# Patient Record
Sex: Female | Born: 1950 | Race: White | Hispanic: No | Smoking: Former smoker
Health system: Southern US, Community
[De-identification: ages and names within clinical notes are randomized; demographics above are authoritative.]

## PROBLEM LIST (undated history)

## (undated) DIAGNOSIS — T7840XA Allergy, unspecified, initial encounter: Secondary | ICD-10-CM

## (undated) DIAGNOSIS — I10 Essential (primary) hypertension: Secondary | ICD-10-CM

## (undated) DIAGNOSIS — H409 Unspecified glaucoma: Secondary | ICD-10-CM

## (undated) DIAGNOSIS — H269 Unspecified cataract: Secondary | ICD-10-CM

## (undated) HISTORY — PX: COLONOSCOPY: SHX174

## (undated) HISTORY — DX: Allergy, unspecified, initial encounter: T78.40XA

## (undated) HISTORY — DX: Unspecified glaucoma: H40.9

## (undated) HISTORY — DX: Unspecified cataract: H26.9

---

## 2013-10-06 ENCOUNTER — Emergency Department (HOSPITAL_COMMUNITY)
Admission: EM | Admit: 2013-10-06 | Discharge: 2013-10-06 | Disposition: A | Discharge status: Home / Self Care | Payer: BC Managed Care – PPO | Attending: Emergency Medicine | Admitting: Emergency Medicine

## 2013-10-06 ENCOUNTER — Encounter (HOSPITAL_COMMUNITY): Payer: Self-pay | Admitting: Emergency Medicine

## 2013-10-06 DIAGNOSIS — I10 Essential (primary) hypertension: Secondary | ICD-10-CM | POA: Insufficient documentation

## 2013-10-06 DIAGNOSIS — F410 Panic disorder [episodic paroxysmal anxiety] without agoraphobia: Secondary | ICD-10-CM | POA: Insufficient documentation

## 2013-10-06 DIAGNOSIS — R002 Palpitations: Secondary | ICD-10-CM

## 2013-10-06 HISTORY — DX: Essential (primary) hypertension: I10

## 2013-10-06 LAB — POCT I-STAT, CHEM 8
BUN: 10 mg/dL (ref 6–23)
Calcium, Ion: 1.22 mmol/L (ref 1.13–1.30)
Chloride: 105 mEq/L (ref 96–112)
Creatinine, Ser: 0.9 mg/dL (ref 0.50–1.10)
Glucose, Bld: 95 mg/dL (ref 70–99)
HCT: 45 % (ref 36.0–46.0)
Hemoglobin: 15.3 g/dL — ABNORMAL HIGH (ref 12.0–15.0)
Potassium: 3.7 mEq/L (ref 3.5–5.1)
Sodium: 144 mEq/L (ref 135–145)
TCO2: 28 mmol/L (ref 0–100)

## 2013-10-06 MED ORDER — LORAZEPAM 1 MG PO TABS
1.0000 mg | ORAL_TABLET | Freq: Once | ORAL | Status: AC
  Administered 2013-10-06: 1 mg via ORAL
  Filled 2013-10-06: qty 1

## 2013-10-06 NOTE — ED Notes (Signed)
Pt states that she is having palpitations, "her heart is racing" and she took her BP at work it was elevated.  Pt states this feels like an anxiety attack.

## 2013-10-06 NOTE — ED Notes (Signed)
Pt states she felt like she was having heart palpitations since yesterday and this morning pt states she was driving and they came on again, she got scared and had to pull over to calm herself down.

## 2013-10-06 NOTE — ED Provider Notes (Signed)
CSN: [id number]     Arrival date & time 10/06/13  0759 History   First MD Initiated Contact with Patient 10/06/13 0809     Chief Complaint  Patient presents with  . Panic Attack  . Palpitations   (Consider location/radiation/quality/duration/timing/severity/associated sxs/prior Treatment) HPI  [age]-year-old female with palpitations. Onset last night when she was is getting ready for bed. She felt like her heart was both beating fast and irregularly. Denies any chest pain or shortness of breath. The symptoms continued before she finally fell asleep. She woke up in her usual state of health. On her way to work she was driving the symptoms began again. Currently feels better, although not completely back to her baseline. Denies any drug use. Drinks 2 cups of coffee in the morning but this has been a retained for several years. No recent medication changes. Past history of hypertension, otherwise healthy. No unusual leg pain or swelling.She had similar symptoms a few years ago when she was going through divorce and it was attributed to anxiety then.   Past Medical History  Diagnosis Date  . Hypertension    History reviewed. No pertinent past surgical history. No family history on file. History  Substance Use Topics  . Smoking status: Never Smoker   . Smokeless tobacco: Not on file  . Alcohol Use: No   OB History   Grav Para Term Preterm Abortions TAB SAB Ect Mult Living                 Review of Systems  All systems reviewed and negative, other than as noted in HPI.   Allergies  Review of patient's allergies indicates no known allergies.  Home Medications  No current outpatient prescriptions on file. BP 163/97  Pulse 85  Temp(Src) 98.5 F (36.9 C) (Oral)  Resp 18  SpO2 99% Physical Exam  Nursing note and vitals reviewed. Constitutional: She appears well-developed and well-nourished. No distress.  HENT:  Head: Normocephalic and atraumatic.  Eyes: Conjunctivae are  normal. Right eye exhibits no discharge. Left eye exhibits no discharge.  Neck: Neck supple.  Cardiovascular: Normal rate, regular rhythm and normal heart sounds.  Exam reveals no gallop and no friction rub.   No murmur heard. Pulmonary/Chest: Effort normal and breath sounds normal. No respiratory distress.  Abdominal: Soft. She exhibits no distension. There is no tenderness.  Musculoskeletal: She exhibits no edema and no tenderness.  Lower extremities symmetric as compared to each other. No calf tenderness. Negative Homan's. No palpable cords.   Neurological: She is alert.  Skin: Skin is warm and dry.  Psychiatric: Her behavior is normal. Thought content normal.  Appears anxious.     ED Course  Procedures (including critical care time) Labs Review Labs Reviewed - No data to display Imaging Review No results found.  EKG Interpretation     Ventricular Rate:  61 PR Interval:  126 QRS Duration: 88 QT Interval:  423 QTC Calculation: 426 R Axis:   71 Text Interpretation:  Sinus rhythm nsst anterior precordial leads            MDM   1. Heart palpitations    62yF with palpitations. EKG fairly unremarkable. No significant ectopy or dysrhythmia during monitoring. Basic labs reassuring. No pain or respiratory complaints. possible anxiety component. Return precautions discussed.     Raeford Razor, MD 10/11/13 [phone]

## 2014-02-15 ENCOUNTER — Ambulatory Visit (INDEPENDENT_AMBULATORY_CARE_PROVIDER_SITE_OTHER): Payer: BC Managed Care – PPO | Admitting: Cardiology

## 2014-02-15 ENCOUNTER — Encounter: Payer: Self-pay | Admitting: Cardiology

## 2014-02-15 VITALS — BP 122/78 | HR 66 | Ht 60.0 in | Wt 101.0 lb

## 2014-02-15 DIAGNOSIS — R002 Palpitations: Secondary | ICD-10-CM

## 2014-02-15 NOTE — Progress Notes (Signed)
[address] [place], [place]  [address] Phone: [phone] Fax:  [phone]  Date:  02/15/2014   ID:  Julie Perkins Perkins, DOB [birth year], MRN [id number]  PCP:  Default, Provider, MD   History of Present Illness: Julie Perkins is a 63 y.o. female here for evaluation of palpitations. She was seen in the emergency department on 10/06/13 with palpitations which began when she was getting ready for bed. She felt as though her heart was beating fast and irregular. She was without chest pain or shortness of breath. After she woke up, she felt better but on her weight at work she began having symptoms again. Pulled over. Went on into work and then left and went to ER. She thought maybe it was because she was going through divorce and had associated anxiety. No symptoms since. No syncope.    Wt Readings from Last 3 Encounters:  02/15/14 101 lb (45.813 kg)     Past Medical History  Diagnosis Date  . Hypertension     History reviewed. No pertinent past surgical history.  Current Outpatient Prescriptions  Medication Sig Dispense Refill  . amLODipine-benazepril (LOTREL) 10-20 MG per capsule Take 1 capsule by mouth daily.      Marland Kitchen. aspirin 81 MG tablet Take 81 mg by mouth daily.      . cholecalciferol (VITAMIN D) 1000 UNITS tablet Take 1,000 Units by mouth daily.       No current facility-administered medications for this visit.    Allergies:   No Known Allergies  Social History:  The patient  reports that she has quit smoking. She does not have any smokeless tobacco history on file. She reports that she does not drink alcohol or use illicit drugs.   Family History  Problem Relation Age of Onset  . Hypertension Mother   . Hypertension Father   . Hypertension Sister   . Hypertension Brother   . Breast cancer Paternal Aunt   . Heart failure Maternal Grandmother   . Cancer - Prostate Maternal Grandfather   . Hypertension Sister   . Hypertension Brother   No  early CAD in family.   ROS:  Please see the history of present illness.   Denies any fevers, chills, strokelike symptoms, syncope, orthopnea, PND, rashes, bleeding, chest pain, shortness of breath   All other systems reviewed and negative.   PHYSICAL EXAM: VS:  BP 122/78  Pulse 66  Ht 5' (1.524 m)  Wt 101 lb (45.813 kg)  BMI 19.73 kg/m2 Thin, in no acute distress HEENT: normal, [place]/AT, EOMI Neck: no JVD, normal carotid upstroke, no bruit Cardiac:  normal S1, S2; RRR; no murmur Lungs:  clear to auscultation bilaterally, no wheezing, rhonchi or rales Abd: soft, nontender, no hepatomegaly, no bruits Ext: no edema, 2+ distal pulses Skin: warm and dry GU: deferred Neuro: no focal abnormalities noted, AAO x 3  EKG:  10/08/13 - NSR no other abnormalites.     ASSESSMENT AND PLAN:  1. Palpitations-possibilities include PSVT, PVCs, PAT. Thankfully, she has not had any recurrence of symptoms. Her symptoms were transient, short-lived, no associated anginal symptoms, no syncope, no shortness of breath. Certainly they could of been exacerbated by stress/anxiety. I do not appreciate any murmurs. No early family history of CAD. At this point, I would continue to monitor her. If symptoms return or become more worrisome, she will contact me. No further workup necessary at this point. Recent TSH in February has been normal per  description by Julie Perkins. I've asked her to make sure that she is not excessive caffeine use. Avoiding decongestants such as Sudafed. 2. We will see back on as-needed basis.  Signed, Donato Schultz, MD [facility]  02/15/2014 3:25 PM

## 2014-02-15 NOTE — Patient Instructions (Signed)
Your physician recommends that you continue on your current medications as directed. Please refer to the Current Medication list given to you today.  Your physician recommends that you follow-up as needed.  

## 2014-08-25 ENCOUNTER — Other Ambulatory Visit: Payer: Self-pay

## 2014-08-25 DIAGNOSIS — Z1231 Encounter for screening mammogram for malignant neoplasm of breast: Secondary | ICD-10-CM

## 2014-09-06 ENCOUNTER — Ambulatory Visit
Admission: RE | Admit: 2014-09-06 | Discharge: 2014-09-06 | Disposition: A | Discharge status: Home / Self Care | Payer: BC Managed Care – PPO | Source: Ambulatory Visit

## 2014-09-06 DIAGNOSIS — Z1231 Encounter for screening mammogram for malignant neoplasm of breast: Secondary | ICD-10-CM

## 2016-01-04 DIAGNOSIS — H40023 Open angle with borderline findings, high risk, bilateral: Secondary | ICD-10-CM | POA: Insufficient documentation

## 2019-05-25 DIAGNOSIS — H2513 Age-related nuclear cataract, bilateral: Secondary | ICD-10-CM | POA: Insufficient documentation

## 2019-11-10 ENCOUNTER — Other Ambulatory Visit: Payer: Self-pay

## 2019-11-10 ENCOUNTER — Ambulatory Visit (INDEPENDENT_AMBULATORY_CARE_PROVIDER_SITE_OTHER): Payer: Medicare Other | Admitting: Family Medicine

## 2019-11-10 ENCOUNTER — Encounter: Payer: Self-pay | Admitting: Family Medicine

## 2019-11-10 VITALS — BP 126/70 | HR 74 | Ht 60.0 in | Wt 100.8 lb

## 2019-11-10 DIAGNOSIS — Z780 Asymptomatic menopausal state: Secondary | ICD-10-CM

## 2019-11-10 DIAGNOSIS — I10 Essential (primary) hypertension: Secondary | ICD-10-CM

## 2019-11-10 DIAGNOSIS — Z1159 Encounter for screening for other viral diseases: Secondary | ICD-10-CM

## 2019-11-10 MED ORDER — ZOSTER VAC RECOMB ADJUVANTED 50 MCG/0.5ML IM SUSR: 0.5000 mL | Freq: Once | INTRAMUSCULAR | 0 refills | Status: AC

## 2019-11-10 NOTE — Assessment & Plan Note (Signed)
Well-controlled with amlodipine 10/Benzapril 20. -Continue current medication -Follow-up BMP

## 2019-11-10 NOTE — Assessment & Plan Note (Signed)
No history of illicit drug use. -Follow-up hepatitis C

## 2019-11-10 NOTE — Patient Instructions (Addendum)
It was so nice to meet you today here is a quick summary of the things we talked about.  Hypertension: Blood pressure looks good today.  We will get blood work to make sure your kidneys and electrolytes are normal.  Mammogram: We will give you information to follow-up with the breast center.  Screening for osteoporosis: We will give you information to follow-up with the breast center for something called a DEXA scan.  Shingles: I will place an order saying that the shingles vaccine.  I will let you know if any of your lab work is abnormal.

## 2019-11-10 NOTE — Assessment & Plan Note (Signed)
-  Follow-up DEXA scan 

## 2019-11-10 NOTE — Progress Notes (Signed)
    Subjective:  Julie Perkins is a 68 y.o. female who presents to the [facility] today to meet her provider.   HPI:  Essential hypertension history of hypertension.  Currently well controlled with amlodipine/benazepril.  No complaints of lightheadedness or dizziness today.  Routine screening for osteoporosis Non-smoker.  Would like to be screened for osteoporosis.  Routine screening for hepatitis C No history of illicit drug use.  Would like to be screened for hepatitis C.  Desire for shingles vaccination Did experience chickenpox as a child.  Would like more information about the shingles vaccine.  Has never had shingles before.  Routine cancer screening She reports an entirely normal colonoscopy in 2014 was told to follow-up for another colonoscopy after 10 years.  She reports that her last Pap smear was performed 2 years ago and was entirely normal.  Chief Complaint noted Review of Symptoms - see HPI PMH - non-smoker   Objective:  Physical Exam: BP 126/70   Pulse 74   Ht 5' (1.524 m)   Wt 100 lb 12.8 oz (45.7 kg)   BMI 19.69 kg/m    Gen: NAD, resting comfortably CV: RRR with no murmurs appreciated Pulm: NWOB, CTAB with no crackles, wheezes, or rhonchi GI: Normal bowel sounds present. Soft, Nontender, Nondistended. MSK: no edema, cyanosis, or clubbing noted Skin: warm, dry Neuro: grossly normal, moves all extremities Psych: Normal affect and thought content  No results found for this or any previous visit (from the past 72 hour(s)).   Assessment/Plan:  Hypertension Well-controlled with amlodipine 10/Benzapril 20. -Continue current medication -Follow-up BMP  Encounter for hepatitis C screening test for low risk patient No history of illicit drug use. -Follow-up hepatitis C  Postmenopausal estrogen deficiency -Follow-up DEXA scan  Desire for shingles vaccination Shingles vaccination discussed.  Advised on possible fatigue/malaise following vaccination.  -Shingles vaccine ordered  Release of information for records from her previous PCP was signed.

## 2019-11-11 ENCOUNTER — Other Ambulatory Visit: Payer: Self-pay | Admitting: Family Medicine

## 2019-11-11 DIAGNOSIS — Z1231 Encounter for screening mammogram for malignant neoplasm of breast: Secondary | ICD-10-CM

## 2019-11-11 LAB — BASIC METABOLIC PANEL
BUN/Creatinine Ratio: 11 — ABNORMAL LOW (ref 12–28)
BUN: 8 mg/dL (ref 8–27)
CO2: 25 mmol/L (ref 20–29)
Calcium: 9.7 mg/dL (ref 8.7–10.3)
Chloride: 106 mmol/L (ref 96–106)
Creatinine, Ser: 0.71 mg/dL (ref 0.57–1.00)
GFR calc Af Amer: 103 mL/min/{1.73_m2} (ref >59)
GFR calc non Af Amer: 89 mL/min/{1.73_m2} (ref >59)
Glucose: 82 mg/dL (ref 65–99)
Potassium: 3.9 mmol/L (ref 3.5–5.2)
Sodium: 144 mmol/L (ref 134–144)

## 2019-11-11 LAB — HEPATITIS C ANTIBODY (REFLEX): HCV Ab: <0.1 s/co ratio (ref 0.0–0.9)

## 2019-11-11 LAB — HCV COMMENT:

## 2019-11-12 ENCOUNTER — Encounter: Payer: Self-pay | Admitting: Family Medicine

## 2019-12-01 ENCOUNTER — Other Ambulatory Visit: Payer: Self-pay | Admitting: *Deleted

## 2019-12-01 MED ORDER — AMLODIPINE BESY-BENAZEPRIL HCL 10-20 MG PO CAPS: 1.0000 | ORAL_CAPSULE | Freq: Every day | ORAL | 3 refills | Status: DC

## 2019-12-31 ENCOUNTER — Ambulatory Visit: Payer: Medicare Other | Attending: Internal Medicine

## 2019-12-31 DIAGNOSIS — Z23 Encounter for immunization: Secondary | ICD-10-CM | POA: Insufficient documentation

## 2019-12-31 NOTE — Progress Notes (Signed)
   Covid-19 Vaccination Clinic  Name:  Shamirah Ivan    MRN: [id number] DOB: [birth year]  12/31/2019  Ms. Howald was observed post Covid-19 immunization for 15 minutes without incidence. She was provided with Vaccine Information Sheet and instruction to access the V-Safe system.   Ms. Montecalvo was instructed to call 911 with any severe reactions post vaccine: Marland Kitchen Difficulty breathing  . Swelling of your face and throat  . A fast heartbeat  . A bad rash all over your body  . Dizziness and weakness    Immunizations Administered    Name Date Dose VIS Date Route   Pfizer COVID-19 Vaccine 12/31/2019  2:14 PM 0.3 mL 11/20/2019 Intramuscular   Manufacturer: [employer], [employer]   Lot: [id number]   NDC: [address]-2

## 2020-01-21 ENCOUNTER — Ambulatory Visit: Payer: Medicare Other | Attending: Internal Medicine

## 2020-01-21 DIAGNOSIS — Z23 Encounter for immunization: Secondary | ICD-10-CM | POA: Insufficient documentation

## 2020-01-21 NOTE — Progress Notes (Signed)
   Covid-19 Vaccination Clinic  Name:  Julie Perkins    MRN: [id number] DOB: 08/20/1951  01/21/2020  [name] was observed post Covid-19 immunization for 15 minutes without incidence. She was provided with Vaccine Information Sheet and instruction to access the V-Safe system.   [name] was instructed to call 911 with any severe reactions post vaccine: Marland Kitchen Difficulty breathing  . Swelling of your face and throat  . A fast heartbeat  . A bad rash all over your body  . Dizziness and weakness    Immunizations Administered    Name Date Dose VIS Date Route   Pfizer COVID-19 Vaccine 01/21/2020  2:00 PM 0.3 mL 11/20/2019 Intramuscular   Manufacturer: [employer], [employer]   Lot: [id number]   NDC: [address]-4

## 2020-02-02 ENCOUNTER — Other Ambulatory Visit: Payer: Medicare Other

## 2020-02-04 ENCOUNTER — Other Ambulatory Visit: Payer: Self-pay

## 2020-02-04 ENCOUNTER — Ambulatory Visit: Payer: Medicare Other

## 2020-02-04 ENCOUNTER — Ambulatory Visit
Admission: RE | Admit: 2020-02-04 | Discharge: 2020-02-04 | Disposition: A | Discharge status: Home / Self Care | Payer: Medicare Other | Source: Ambulatory Visit | Attending: Family Medicine | Admitting: Family Medicine

## 2020-02-04 DIAGNOSIS — Z780 Asymptomatic menopausal state: Secondary | ICD-10-CM

## 2020-02-22 NOTE — Progress Notes (Signed)
    SUBJECTIVE:   CHIEF COMPLAINT / HPI:   Osteoporosis [name] had a recent DEXA scan which showed a T score of -2.8 indicative of osteoporosis.  At today's visit, we had an extended discussion about the causes of osteoporosis, the risks and the appropriate treatment options.  Julie Perkins does not smoke and very rarely drinks alcohol.  She has clearly done much of her own reading and is interested in starting therapy today.  Health maintenance She is due for her tetanus and pneumonia vaccination.  She is interested in both of these vaccines.  She reports that she had a colonoscopy at 56 and was told she did not need a repeat until she is 58.    PERTINENT  PMH / PSH: No previous history of bone fractures  OBJECTIVE:   BP 124/78   Pulse 66   Wt 98 lb (44.5 kg)   SpO2 100%   BMI 19.14 kg/m    General: Alert and cooperative and appears to be in no acute distress HEENT: Neck non-tender without lymphadenopathy, masses or thyromegaly Cardio: Normal S1 and S2, no S3 or S4. Rhythm is regular. No murmurs or rubs.   Pulm: Clear to auscultation bilaterally, no crackles, wheezing, or diminished breath sounds. Normal respiratory effort Abdomen: Bowel sounds normal. Abdomen soft and non-tender.  Extremities: No peripheral edema. Warm/ well perfused.  Strong radial pulse. Neuro: Cranial nerves grossly intact   ASSESSMENT/PLAN:   Osteoporosis -Follow-up vitamin D level and calcium lab -If labs are normal, will call Julie Perkins to begin her alendronate 10 mg daily (she elected not to do weekly doses) -Follow-up DEXA scan in 2023 -Continue calcium and vitamin D supplementation -Handout provided  Health maintenance -Pneumonia vaccine provided today -Tdap ordered to pharmacy -Plan to bring colonoscopy documentation to next visit   [name], MD [facility]

## 2020-02-23 ENCOUNTER — Ambulatory Visit (INDEPENDENT_AMBULATORY_CARE_PROVIDER_SITE_OTHER): Payer: Medicare Other | Admitting: Family Medicine

## 2020-02-23 ENCOUNTER — Other Ambulatory Visit: Payer: Self-pay

## 2020-02-23 ENCOUNTER — Encounter: Payer: Self-pay | Admitting: Family Medicine

## 2020-02-23 VITALS — BP 124/78 | HR 66 | Wt 98.0 lb

## 2020-02-23 DIAGNOSIS — M810 Age-related osteoporosis without current pathological fracture: Secondary | ICD-10-CM

## 2020-02-23 DIAGNOSIS — Z23 Encounter for immunization: Secondary | ICD-10-CM

## 2020-02-23 MED ORDER — TETANUS-DIPHTH-ACELL PERTUSSIS 5-2.5-18.5 LF-MCG/0.5 IM SUSP: 0.5000 mL | Freq: Once | INTRAMUSCULAR | 0 refills | Status: AC

## 2020-02-23 MED ORDER — ALENDRONATE SODIUM 10 MG PO TABS: 10.0000 mg | ORAL_TABLET | Freq: Every day | ORAL | 0 refills | Status: DC

## 2020-02-23 NOTE — Assessment & Plan Note (Addendum)
-  Follow-up vitamin D level and calcium lab -If labs are normal, will call Julie Perkins to begin her alendronate 10 mg daily (she elected not to do weekly doses) -Follow-up DEXA scan in 2023 -Continue calcium and vitamin D supplementation -Handout provided

## 2020-02-23 NOTE — Patient Instructions (Addendum)
It was so nice to see you today [name].  I will let you know if there are any concerning results from your labs.  You can continue your calcium and vitamin D supplementation as you have been doing (the daily calcium recommendation is 1200 mg daily).  Once we have the results of your tests back, I will let you know that you can start taking your new medication alendronate.  Osteoporosis  Osteoporosis is thinning and loss of density in your bones. Osteoporosis makes bones more brittle and fragile and more likely to break (fracture). Over time, osteoporosis can cause your bones to become so weak that they fracture after a minor fall. Bones in the hip, wrist, and spine are most likely to fracture due to osteoporosis. What are the causes? The exact cause of this condition is not known. What increases the risk? You may be at greater risk for osteoporosis if you:  Have a family history of the condition.  Have poor nutrition.  Use steroid medicines, such as prednisone.  Are female.  Are age 75 or older.  Smoke or have a history of smoking.  Are not physically active (are sedentary).  Are white (Caucasian) or of Asian descent.  Have a small body frame.  Take certain medicines, such as antiseizure medicines. What are the signs or symptoms? A fracture might be the first sign of osteoporosis, especially if the fracture results from a fall or injury that usually would not cause a bone to break. Other signs and symptoms include:  Pain in the neck or low back.  Stooped posture.  Loss of height. How is this diagnosed? This condition may be diagnosed based on:  Your medical history.  A physical exam.  A bone mineral density test, also called a DXA or DEXA test (dual-energy X-ray absorptiometry test). This test uses X-rays to measure the amount of minerals in your bones. How is this treated? The goal of treatment is to strengthen your bones and lower your risk for a fracture.  Treatment may involve:  Making lifestyle changes, such as: ? Including foods with more calcium and vitamin D in your diet. ? Doing weight-bearing and muscle-strengthening exercises. ? Stopping tobacco use. ? Limiting alcohol intake.  Taking medicine to slow the process of bone loss or to increase bone density.  Taking daily supplements of calcium and vitamin D.  Taking hormone replacement medicines, such as estrogen for women and testosterone for men.  Monitoring your levels of calcium and vitamin D. Follow these instructions at home:  Activity  Exercise as told by your health care provider. Ask your health care provider what exercises and activities are safe for you. You should do: ? Exercises that make you work against gravity (weight-bearing exercises), such as tai chi, yoga, or walking. ? Exercises to strengthen muscles, such as lifting weights. Lifestyle  Limit alcohol intake to no more than 1 drink a day for nonpregnant women and 2 drinks a day for men. One drink equals 12 oz of beer, 5 oz of wine, or 1 oz of hard liquor.  Do not use any products that contain nicotine or tobacco, such as cigarettes and e-cigarettes. If you need help quitting, ask your health care provider. Preventing falls  Use devices to help you move around (mobility aids) as needed, such as canes, walkers, scooters, or crutches.  Keep rooms well-lit and clutter-free.  Remove tripping hazards from walkways, including cords and throw rugs.  Install grab bars in bathrooms and safety rails  on stairs.  Use rubber mats in the bathroom and other areas that are often wet or slippery.  Wear closed-toe shoes that fit well and support your feet. Wear shoes that have rubber soles or low heels.  Review your medicines with your health care provider. Some medicines can cause dizziness or changes in blood pressure, which can increase your risk of falling. General instructions  Include calcium and vitamin D in  your diet. Calcium is important for bone health, and vitamin D helps your body to absorb calcium. Good sources of calcium and vitamin D include: ? Certain fatty fish, such as salmon and tuna. ? Products that have calcium and vitamin D added to them (fortified products), such as fortified cereals. ? Egg yolks. ? Cheese. ? Liver.  Take over-the-counter and prescription medicines only as told by your health care provider.  Keep all follow-up visits as told by your health care provider. This is important. Contact a health care provider if:  You have never been screened for osteoporosis and you are: ? A woman who is age 67 or older. ? A man who is age 69 or older. Get help right away if:  You fall or injure yourself. Summary  Osteoporosis is thinning and loss of density in your bones. This makes bones more brittle and fragile and more likely to break (fracture),even with minor falls.  The goal of treatment is to strengthen your bones and reduce your risk for a fracture.  Include calcium and vitamin D in your diet. Calcium is important for bone health, and vitamin D helps your body to absorb calcium.  Talk with your health care provider about screening for osteoporosis if you are a woman who is age 36 or older, or a man who is age 64 or older. This information is not intended to replace advice given to you by your health care provider. Make sure you discuss any questions you have with your health care provider. Document Revised: 11/08/2017 Document Reviewed: 09/20/2017 Elsevier Patient Education  2020 [employer].

## 2020-02-24 ENCOUNTER — Other Ambulatory Visit: Payer: Self-pay | Admitting: Family Medicine

## 2020-02-24 LAB — CALCIUM: Calcium: 9.8 mg/dL (ref 8.7–10.3)

## 2020-02-24 LAB — VITAMIN D 25 HYDROXY (VIT D DEFICIENCY, FRACTURES): Vit D, 25-Hydroxy: 38.1 ng/mL (ref 30.0–100.0)

## 2020-02-24 MED ORDER — CALCIUM/VITAMIN D 600-400 MG-UNIT PO TABS: 1200.0000 mg | ORAL_TABLET | Freq: Every day | ORAL | 2 refills | Status: AC

## 2020-03-17 ENCOUNTER — Ambulatory Visit
Admission: RE | Admit: 2020-03-17 | Discharge: 2020-03-17 | Disposition: A | Discharge status: Home / Self Care | Payer: Medicare Other | Source: Ambulatory Visit | Attending: Family Medicine | Admitting: Family Medicine

## 2020-03-17 ENCOUNTER — Other Ambulatory Visit: Payer: Self-pay

## 2020-03-17 DIAGNOSIS — Z1231 Encounter for screening mammogram for malignant neoplasm of breast: Secondary | ICD-10-CM | POA: Diagnosis not present

## 2020-03-25 ENCOUNTER — Encounter: Payer: Self-pay | Admitting: Family Medicine

## 2020-03-25 ENCOUNTER — Other Ambulatory Visit: Payer: Self-pay | Admitting: Family Medicine

## 2020-03-25 MED ORDER — ALENDRONATE SODIUM 10 MG PO TABS: 10.0000 mg | ORAL_TABLET | Freq: Every day | ORAL | 3 refills | Status: DC

## 2020-05-19 ENCOUNTER — Encounter: Payer: Self-pay | Admitting: Family Medicine

## 2020-06-22 DIAGNOSIS — H2513 Age-related nuclear cataract, bilateral: Secondary | ICD-10-CM | POA: Diagnosis not present

## 2020-06-22 DIAGNOSIS — H40003 Preglaucoma, unspecified, bilateral: Secondary | ICD-10-CM | POA: Diagnosis not present

## 2020-06-22 DIAGNOSIS — H40053 Ocular hypertension, bilateral: Secondary | ICD-10-CM | POA: Diagnosis not present

## 2020-07-25 ENCOUNTER — Other Ambulatory Visit: Payer: Self-pay | Admitting: Family Medicine

## 2020-10-03 ENCOUNTER — Ambulatory Visit: Payer: Medicare HMO

## 2020-10-24 DIAGNOSIS — H40053 Ocular hypertension, bilateral: Secondary | ICD-10-CM | POA: Diagnosis not present

## 2020-10-24 DIAGNOSIS — H2513 Age-related nuclear cataract, bilateral: Secondary | ICD-10-CM | POA: Diagnosis not present

## 2020-10-24 DIAGNOSIS — H524 Presbyopia: Secondary | ICD-10-CM | POA: Diagnosis not present

## 2020-10-24 DIAGNOSIS — H40003 Preglaucoma, unspecified, bilateral: Secondary | ICD-10-CM | POA: Diagnosis not present

## 2020-10-24 DIAGNOSIS — H5213 Myopia, bilateral: Secondary | ICD-10-CM | POA: Diagnosis not present

## 2020-10-24 DIAGNOSIS — H52203 Unspecified astigmatism, bilateral: Secondary | ICD-10-CM | POA: Diagnosis not present

## 2020-11-15 DIAGNOSIS — H5213 Myopia, bilateral: Secondary | ICD-10-CM | POA: Diagnosis not present

## 2020-11-15 DIAGNOSIS — H524 Presbyopia: Secondary | ICD-10-CM | POA: Diagnosis not present

## 2020-11-15 DIAGNOSIS — H52209 Unspecified astigmatism, unspecified eye: Secondary | ICD-10-CM | POA: Diagnosis not present

## 2020-12-20 ENCOUNTER — Other Ambulatory Visit: Payer: Self-pay

## 2020-12-20 MED ORDER — AMLODIPINE BESY-BENAZEPRIL HCL 10-20 MG PO CAPS: 1.0000 | ORAL_CAPSULE | Freq: Every day | ORAL | 3 refills | Status: DC

## 2020-12-20 MED ORDER — ALENDRONATE SODIUM 10 MG PO TABS: 10.0000 mg | ORAL_TABLET | Freq: Every day | ORAL | 3 refills | Status: DC

## 2021-02-02 ENCOUNTER — Other Ambulatory Visit: Payer: Self-pay | Admitting: Family Medicine

## 2021-02-02 DIAGNOSIS — Z1231 Encounter for screening mammogram for malignant neoplasm of breast: Secondary | ICD-10-CM

## 2021-03-07 ENCOUNTER — Ambulatory Visit (INDEPENDENT_AMBULATORY_CARE_PROVIDER_SITE_OTHER): Payer: Medicare HMO

## 2021-03-07 VITALS — Ht 60.0 in | Wt 98.0 lb

## 2021-03-07 DIAGNOSIS — Z0000 Encounter for general adult medical examination without abnormal findings: Secondary | ICD-10-CM

## 2021-03-07 NOTE — Patient Instructions (Signed)
You spoke to Julie Perkins, Julie Perkins over the phone for your annual wellness visit.  We discussed goals: Goals    . DIET - EAT MORE FRUITS AND VEGETABLES      We also discussed recommended health maintenance. As discussed, you are pretty much up to date with everything! We need to do a ROI for your colonoscopy report and discuss sending a tetanus vaccine to your pharmacy at PCP visit.   Health Maintenance  Topic Date Due  . TETANUS/TDAP  Never done  . COLONOSCOPY (Pts 45-42yr Insurance coverage will need to be confirmed)  Never done  . PNA vac Low Risk Adult (2 of 2 - PPSV23) 02/22/2021  . MAMMOGRAM  03/17/2022  . INFLUENZA VACCINE  Completed  . DEXA SCAN  Completed  . COVID-19 Vaccine  Completed  . Hepatitis C Screening  Completed  . HPV VACCINES  Aged Out   Mammogram apt 4/15. PCP apt 4/20. ROI for colonoscopy.  Preventive Care 624Years and Older, Female Preventive care refers to lifestyle choices and visits with your health care provider that can promote health and wellness. This includes:  A yearly physical exam. This is also called an annual wellness visit.  Regular dental and eye exams.  Immunizations.  Screening for certain conditions.  Healthy lifestyle choices, such as: ? Eating a healthy diet. ? Getting regular exercise. ? Not using drugs or products that contain nicotine and tobacco. ? Limiting alcohol use. What can I expect for my preventive care visit? Physical exam Your health care provider will check your:  Height and weight. These may be used to calculate your BMI (body mass index). BMI is a measurement that tells if you are at a healthy weight.  Heart rate and blood pressure.  Body temperature.  Skin for abnormal spots. Counseling Your health care provider may ask you questions about your:  Past medical problems.  Family's medical history.  Alcohol, tobacco, and drug use.  Emotional well-being.  Home life and relationship  well-being.  Sexual activity.  Diet, exercise, and sleep habits.  History of falls.  Memory and ability to understand (cognition).  Work and work eStatistician  Pregnancy and menstrual history.  Access to firearms. What immunizations do I need? Vaccines are usually given at various ages, according to a schedule. Your health care provider will recommend vaccines for you based on your age, medical history, and lifestyle or other factors, such as travel or where you work.   What tests do I need? Blood tests  Lipid and cholesterol levels. These may be checked every 5 years, or more often depending on your overall health.  Hepatitis C test.  Hepatitis B test. Screening  Lung cancer screening. You may have this screening every year starting at age 71465if you have a 30-pack-year history of smoking and currently smoke or have quit within the past 15 years.  Colorectal cancer screening. ? All adults should have this screening starting at age 71432and continuing until age 68 ? Your health care provider may recommend screening at age 2719if you are at increased risk. ? You will have tests every 1-10 years, depending on your results and the type of screening test.  Diabetes screening. ? This is done by checking your blood sugar (glucose) after you have not eaten for a while (fasting). ? You may have this done every 1-3 years.  Mammogram. ? This may be done every 1-2 years. ? Talk with your health care provider about how often  you should have regular mammograms.  Abdominal aortic aneurysm (AAA) screening. You may need this if you are a current or former smoker.  BRCA-related cancer screening. This may be done if you have a family history of breast, ovarian, tubal, or peritoneal cancers. Other tests  STD (sexually transmitted disease) testing, if you are at risk.  Bone density scan. This is done to screen for osteoporosis. You may have this done starting at age 39. Talk with your  health care provider about your test results, treatment options, and if necessary, the need for more tests. Follow these instructions at home: Eating and drinking  Eat a diet that includes fresh fruits and vegetables, whole grains, lean protein, and low-fat dairy products. Limit your intake of foods with high amounts of sugar, saturated fats, and salt.  Take vitamin and mineral supplements as recommended by your health care provider.  Do not drink alcohol if your health care provider tells you not to drink.  If you drink alcohol: ? Limit how much you have to 0-1 drink a day. ? Be aware of how much alcohol is in your drink. In the [place] one drink equals one 12 oz bottle of beer (355 mL), one 5 oz glass of wine (148 mL), or one 1 oz glass of hard liquor (44 mL).   Lifestyle  Take daily care of your teeth and gums. Brush your teeth every morning and night with fluoride toothpaste. Floss one time each day.  Stay active. Exercise for at least 30 minutes 5 or more days each week.  Do not use any products that contain nicotine or tobacco, such as cigarettes, e-cigarettes, and chewing tobacco. If you need help quitting, ask your health care provider.  Do not use drugs.  If you are sexually active, practice safe sex. Use a condom or other form of protection in order to prevent STIs (sexually transmitted infections).  Talk with your health care provider about taking a low-dose aspirin or statin.  Find healthy ways to cope with stress, such as: ? Meditation, yoga, or listening to music. ? Journaling. ? Talking to a trusted person. ? Spending time with friends and family. Safety  Always wear your seat belt while driving or riding in a vehicle.  Do not drive: ? If you have been drinking alcohol. Do not ride with someone who has been drinking. ? When you are tired or distracted. ? While texting.  Wear a helmet and other protective equipment during sports activities.  If you have  firearms in your house, make sure you follow all gun safety procedures. What's next?  Visit your health care provider once a year for an annual wellness visit.  Ask your health care provider how often you should have your eyes and teeth checked.  Stay up to date on all vaccines. This information is not intended to replace advice given to you by your health care provider. Make sure you discuss any questions you have with your health care provider. Document Revised: 11/16/2020 Document Reviewed: 11/20/2018 Elsevier Patient Education  2021 [place].   Our clinic's number is [phone]. Please call with questions or concerns about what we discussed today.

## 2021-03-07 NOTE — Progress Notes (Addendum)
Subjective:   Julie Perkins is a 70 y.o. female who presents for Medicare Annual (Subsequent) preventive examination.  Patient consented to have virtual visit and was identified by name and date of birth. Method of visit: Telephone  Encounter participants: Patient: Julie Perkins - located at Home Nurse/Provider: Steva Colder - located at [facility] Others (if applicable): NA  Review of Systems: Defer to PCP  Cardiac Risk Factors include: advanced age (>76men, >2 women);hypertension  Objective:   Vitals: Ht 5' (1.524 m)   Wt 98 lb (44.5 kg)   BMI 19.14 kg/m   Body mass index is 19.14 kg/m.  Advanced Directives 03/07/2021 02/23/2020 11/10/2019  Does Patient Have a Medical Advance Directive? No No No  Would patient like information on creating a medical advance directive? No - Patient declined No - Patient declined No - Patient declined   Tobacco Social History   Tobacco Use  Smoking Status Former Smoker  . Years: 3.00  . Quit date: 17  . Years since quitting: 37.2  Smokeless Tobacco Never Used     Counseling given: No plans to restart  Clinical Intake:  Pre-visit preparation completed: Yes  Pain Score: 0-No pain  How often do you need to have someone help you when you read instructions, pamphlets, or other written materials from your doctor or pharmacy?: 2 - Rarely What is the last grade level you completed in school?: high school  Interpreter Needed?: No  Past Medical History:  Diagnosis Date  . Glaucoma   . Hypertension    No past surgical history on file. Family History  Problem Relation Age of Onset  . Hypertension Mother   . Hypertension Father   . Hypertension Sister   . Hypertension Brother   . Breast cancer Paternal Aunt   . Heart failure Maternal Grandmother   . Hypertension Sister   . Hypertension Brother   . Cancer - Prostate Maternal Grandfather    Social History   Socioeconomic History  . Marital status: Divorced    Spouse name:  Not on file  . Number of children: 2  . Years of education: 41  . Highest education level: Not on file  Occupational History  . Not on file  Tobacco Use  . Smoking status: Former Smoker    Years: 3.00    Quit date: 1985    Years since quitting: 37.2  . Smokeless tobacco: Never Used  Substance and Sexual Activity  . Alcohol use: No  . Drug use: No  . Sexual activity: Yes    Birth control/protection: Post-menopausal  Other Topics Concern  . Not on file  Social History Narrative   Patient lives alone in [place].   Patient has 2 sons and 3 grandchildren.   Patient enjoys reading, dancing, walking her 2 dogs, and spending time with family.    Social Determinants of Health   Financial Resource Strain: Low Risk   . Difficulty of Paying Living Expenses: Not hard at all  Food Insecurity: No Food Insecurity  . Worried About Programme researcher, broadcasting/film/video in the Last Year: Never true  . Ran Out of Food in the Last Year: Never true  Transportation Needs: No Transportation Needs  . Lack of Transportation (Medical): No  . Lack of Transportation (Non-Medical): No  Physical Activity: Insufficiently Active  . Days of Exercise per Week: 7 days  . Minutes of Exercise per Session: 20 min  Stress: No Stress Concern Present  . Feeling of Stress : Only a  little  Social Connections: Moderately Integrated  . Frequency of Communication with Friends and Family: More than three times a week  . Frequency of Social Gatherings with Friends and Family: Twice a week  . Attends Religious Services: More than 4 times per year  . Active Member of Clubs or Organizations: Yes  . Attends Banker Meetings: More than 4 times per year  . Marital Status: Divorced   Outpatient Encounter Medications as of 03/07/2021  Medication Sig  . alendronate (FOSAMAX) 10 MG tablet Take 1 tablet (10 mg total) by mouth daily before breakfast. Take with a full glass of water on an empty stomach.  Marland Kitchen amLODipine-benazepril  (LOTREL) 10-20 MG capsule Take 1 capsule by mouth daily.  Marland Kitchen aspirin 81 MG tablet Take 81 mg by mouth daily.  . Calcium Carb-Cholecalciferol (CALCIUM/VITAMIN D) 600-400 MG-UNIT TABS Take 1,200 mg by mouth daily. The recommendation is to have 1200 mg of calcium daily.  Take 2 tablets daily (do not take with alendronate)  . cholecalciferol (VITAMIN D) 1000 UNITS tablet Take 1,000 Units by mouth daily.   No facility-administered encounter medications on file as of 03/07/2021.   Activities of Daily Living In your present state of health, do you have any difficulty performing the following activities: 03/07/2021  Hearing? N  Vision? Y  Difficulty concentrating or making decisions? N  Walking or climbing stairs? N  Dressing or bathing? N  Doing errands, shopping? N  Preparing Food and eating ? N  Using the Toilet? N  In the past six months, have you accidently leaked urine? Y  Do you have problems with loss of bowel control? N  Managing your Medications? N  Managing your Finances? N  Housekeeping or managing your Housekeeping? N  Some recent data might be hidden   Patient Care Team: [name], MD as PCP - General (Family Medicine)    Assessment:   This is a routine wellness examination for Julie Perkins.  Exercise Activities and Dietary recommendations Current Exercise Habits: Home exercise routine, Type of exercise: walking, Time (Minutes): 20, Frequency (Times/Week): 7, Weekly Exercise (Minutes/Week): 140, Exercise limited by: None identified  Goals    . DIET - EAT MORE FRUITS AND VEGETABLES      Fall Risk Fall Risk  03/07/2021 02/23/2020 11/10/2019  Falls in the past year? 0 0 0   Is the patient's home free of loose throw rugs in walkways, pet beds, electrical cords, etc?   yes      Grab bars in the bathroom? yes      Handrails on the stairs?   yes      Adequate lighting?   yes  Patient rating of health (0-10) scale: 10   Depression Screen PHQ 2/9 Scores 03/07/2021 03/07/2021  02/23/2020 11/10/2019  PHQ - 2 Score 0 0 0 0    Cognitive Function  6CIT Screen 03/07/2021  What Year? 0 points  What month? 0 points  What time? 0 points  Count back from 20 0 points  Months in reverse 0 points  Repeat phrase 0 points  Total Score 0   Immunization History  Administered Date(s) Administered  . Influenza, High Dose Seasonal PF 09/07/2019  . Influenza-Unspecified 09/09/2020  . PFIZER(Purple Top)SARS-COV-2 Vaccination 12/31/2019, 01/21/2020, 11/30/2020  . Pneumococcal Conjugate-13 02/23/2020   Screening Tests Health Maintenance  Topic Date Due  . TETANUS/TDAP  Never done  . COLONOSCOPY (Pts 45-35yrs Insurance coverage will need to be confirmed)  Never done  . PNA vac Low  Risk Adult (2 of 2 - PPSV23) 02/22/2021  . MAMMOGRAM  03/17/2022  . INFLUENZA VACCINE  Completed  . DEXA SCAN  Completed  . COVID-19 Vaccine  Completed  . Hepatitis C Screening  Completed  . HPV VACCINES  Aged Out   Cancer Screenings: Lung: Low Dose CT Chest recommended if Age 1-80 years, 30 pack-year currently smoking OR have quit w/in 15years. Patient does not qualify. Breast:  Up to date on Mammogram? Yes   Up to date of Bone Density/Dexa? Yes Colorectal: age 27, reports on ten year track  Additional Screenings: Hepatitis C Screening: Completed  Plan:  Mammogram apt 4/15. PCP apt 4/20. ROI for colonoscopy.  I have personally reviewed and noted the following in the patient's chart:   . Medical and social history . Use of alcohol, tobacco or illicit drugs  . Current medications and supplements . Functional ability and status . Nutritional status . Physical activity . Advanced directives . List of other physicians . Hospitalizations, surgeries, and ER visits in previous 12 months . Vitals . Screenings to include cognitive, depression, and falls . Referrals and appointments  In addition, I have reviewed and discussed with patient certain preventive protocols, quality metrics,  and best practice recommendations. A written personalized care plan for preventive services as well as general preventive health recommendations were provided to patient.  This visit was conducted virtually in the setting of the COVID19 pandemic.    Steva Colder, CMA  03/07/2021     I have reviewed this visit and agree with the documentation.  [name], MD

## 2021-03-22 DIAGNOSIS — H40053 Ocular hypertension, bilateral: Secondary | ICD-10-CM | POA: Diagnosis not present

## 2021-03-24 ENCOUNTER — Other Ambulatory Visit: Payer: Self-pay

## 2021-03-24 ENCOUNTER — Ambulatory Visit
Admission: RE | Admit: 2021-03-24 | Discharge: 2021-03-24 | Disposition: A | Discharge status: Home / Self Care | Payer: Medicare HMO | Source: Ambulatory Visit | Attending: Family Medicine | Admitting: Family Medicine

## 2021-03-24 DIAGNOSIS — Z1231 Encounter for screening mammogram for malignant neoplasm of breast: Secondary | ICD-10-CM

## 2021-03-29 ENCOUNTER — Encounter: Payer: Self-pay | Admitting: Family Medicine

## 2021-03-29 ENCOUNTER — Ambulatory Visit (INDEPENDENT_AMBULATORY_CARE_PROVIDER_SITE_OTHER): Payer: Medicare HMO | Admitting: Family Medicine

## 2021-03-29 ENCOUNTER — Other Ambulatory Visit: Payer: Self-pay

## 2021-03-29 VITALS — BP 126/66 | HR 78 | Ht 60.0 in | Wt 102.1 lb

## 2021-03-29 DIAGNOSIS — M810 Age-related osteoporosis without current pathological fracture: Secondary | ICD-10-CM | POA: Diagnosis not present

## 2021-03-29 DIAGNOSIS — I10 Essential (primary) hypertension: Secondary | ICD-10-CM

## 2021-03-29 DIAGNOSIS — Z23 Encounter for immunization: Secondary | ICD-10-CM

## 2021-03-29 NOTE — Progress Notes (Signed)
    SUBJECTIVE:   CHIEF COMPLAINT / HPI:   Osteoporosis Current medication includes: -Alendronate 10 mg daily -Calcium/vitamin D supplementation daily  Hypertension Her current medication includes -Amlodipine-benazepril 10-20 mg daily  Colon cancer screening She reports that she had a colonoscopy performed when she was 62 and she contacted the provider about the results.  She was told that our office only needs to fax over her release of information for them to send the results.  She was told she does not need a repeat colonoscopy until she is 89.  PERTINENT  PMH / PSH: Non-smoker  OBJECTIVE:   BP 126/66   Pulse 78   Ht 5' (1.524 m)   Wt 102 lb 2 oz (46.3 kg)   SpO2 99%   BMI 19.94 kg/m   General: Alert and cooperative and appears to be in no acute distress Cardio: Normal S1 and S2, no S3 or S4. Rhythm is regular. No murmurs or rubs.   Pulm: Clear to auscultation bilaterally, no crackles, wheezing, or diminished breath sounds. Normal respiratory effort Abdomen: Bowel sounds normal. Abdomen soft and non-tender.  Extremities: No peripheral edema. Warm/ well perfused.  Strong radial pulses. Neuro: Cranial nerves grossly intact  ASSESSMENT/PLAN:   Hypertension Blood pressure well controlled today. -Continue amlodipine/BenzePrO -Follow-up BMP  Osteoporosis -Continue alendronate daily -Follow-up BMP   Desire for vaccination against pneumococcal infection -Pneumococcal 20 administered today  Colon cancer screening -Release of information filled out and faxed today  Return to clinic in 1 year  [name], MD [facility]

## 2021-03-29 NOTE — Patient Instructions (Signed)
It was great to see you today.  Here is a quick review of the things we talked about:   Osteoporosis: Continue taking.  Daily alendronate.  We are going to follow-up with some blood work today.  Pneumonia vaccine: Recommended to get another pneumonia vaccine today.  COVID-vaccine: Sadly, we do not have a supply of COVID-vaccine to provide today.  You are eligible for your second booster.  I recommend you call the clinic when you have free time in the next week or 2 to see if our shipment has arrived free to come in for a nursing visit to get a shot.  Colon cancer screening: We will follow-up with your colonoscopy to make sure you do not need further colon cancer screening.  If all of your labs are normal, I will send you a message over my chart or send you a letter.  If there is anything to discuss, I will give you a phone call.

## 2021-03-29 NOTE — Assessment & Plan Note (Signed)
Blood pressure well controlled today. -Continue amlodipine/BenzePrO -Follow-up BMP

## 2021-03-29 NOTE — Assessment & Plan Note (Signed)
-  Continue alendronate daily -Follow-up BMP

## 2021-03-30 LAB — BASIC METABOLIC PANEL
BUN/Creatinine Ratio: 21 (ref 12–28)
BUN: 16 mg/dL (ref 8–27)
CO2: 23 mmol/L (ref 20–29)
Calcium: 9.9 mg/dL (ref 8.7–10.3)
Chloride: 107 mmol/L — ABNORMAL HIGH (ref 96–106)
Creatinine, Ser: 0.77 mg/dL (ref 0.57–1.00)
Glucose: 74 mg/dL (ref 65–99)
Potassium: 3.7 mmol/L (ref 3.5–5.2)
Sodium: 144 mmol/L (ref 134–144)
eGFR: 84 mL/min/{1.73_m2} (ref >59)

## 2021-04-24 DIAGNOSIS — H40053 Ocular hypertension, bilateral: Secondary | ICD-10-CM | POA: Diagnosis not present

## 2021-04-24 DIAGNOSIS — H40003 Preglaucoma, unspecified, bilateral: Secondary | ICD-10-CM | POA: Diagnosis not present

## 2021-09-20 ENCOUNTER — Telehealth: Payer: Self-pay

## 2021-09-20 ENCOUNTER — Other Ambulatory Visit: Payer: Self-pay | Admitting: Family Medicine

## 2021-09-20 DIAGNOSIS — Z1211 Encounter for screening for malignant neoplasm of colon: Secondary | ICD-10-CM

## 2021-09-20 NOTE — Telephone Encounter (Signed)
Patient calls nurse line requesting referral for colonoscopy. Patient states that she received a phone call from automated system stating she was due for colonoscopy, however, patient had last colonoscopy in 07/2013.   Last OV 03/29/2021.  Please advise.   Veronda Prude, RN

## 2021-10-18 ENCOUNTER — Other Ambulatory Visit: Payer: Self-pay | Admitting: Family Medicine

## 2021-11-10 DIAGNOSIS — H52209 Unspecified astigmatism, unspecified eye: Secondary | ICD-10-CM | POA: Diagnosis not present

## 2021-11-10 DIAGNOSIS — H2513 Age-related nuclear cataract, bilateral: Secondary | ICD-10-CM | POA: Diagnosis not present

## 2021-11-10 DIAGNOSIS — H52203 Unspecified astigmatism, bilateral: Secondary | ICD-10-CM | POA: Diagnosis not present

## 2021-11-10 DIAGNOSIS — H5213 Myopia, bilateral: Secondary | ICD-10-CM | POA: Diagnosis not present

## 2021-11-10 DIAGNOSIS — H40053 Ocular hypertension, bilateral: Secondary | ICD-10-CM | POA: Diagnosis not present

## 2021-11-10 DIAGNOSIS — H524 Presbyopia: Secondary | ICD-10-CM | POA: Diagnosis not present

## 2022-02-08 ENCOUNTER — Other Ambulatory Visit: Payer: Self-pay | Admitting: Family Medicine

## 2022-02-08 DIAGNOSIS — Z1231 Encounter for screening mammogram for malignant neoplasm of breast: Secondary | ICD-10-CM

## 2022-03-01 ENCOUNTER — Other Ambulatory Visit: Payer: Self-pay

## 2022-03-02 MED ORDER — ALENDRONATE SODIUM 10 MG PO TABS: 10.0000 mg | ORAL_TABLET | Freq: Every day | ORAL | 3 refills | Status: DC

## 2022-03-26 ENCOUNTER — Ambulatory Visit
Admission: RE | Admit: 2022-03-26 | Discharge: 2022-03-26 | Disposition: A | Discharge status: Home / Self Care | Payer: Medicare HMO | Source: Ambulatory Visit | Attending: Family Medicine | Admitting: Family Medicine

## 2022-03-26 DIAGNOSIS — Z1231 Encounter for screening mammogram for malignant neoplasm of breast: Secondary | ICD-10-CM

## 2022-03-27 ENCOUNTER — Encounter: Payer: Self-pay | Admitting: Family Medicine

## 2022-03-27 ENCOUNTER — Ambulatory Visit (INDEPENDENT_AMBULATORY_CARE_PROVIDER_SITE_OTHER): Payer: Medicare HMO | Admitting: Family Medicine

## 2022-03-27 VITALS — BP 131/76 | HR 83 | Ht 60.0 in | Wt 98.0 lb

## 2022-03-27 DIAGNOSIS — I10 Essential (primary) hypertension: Secondary | ICD-10-CM | POA: Diagnosis not present

## 2022-03-27 DIAGNOSIS — Z13220 Encounter for screening for lipoid disorders: Secondary | ICD-10-CM | POA: Diagnosis not present

## 2022-03-27 DIAGNOSIS — M810 Age-related osteoporosis without current pathological fracture: Secondary | ICD-10-CM

## 2022-03-27 NOTE — Assessment & Plan Note (Signed)
Stable on bisphosphonate. ?- Continue alendronate 10 mg daily ?- DEXA scan ordered ?

## 2022-03-27 NOTE — Patient Instructions (Addendum)
It was so great seeing you today! Today we discussed the following: ? ?- Your blood pressure looks great, we are going to check your kidneys to make sure everything still looks the same as last year ? ?- We are going to check your cholesterol today ? ?- Your DEXA scan has been ordered.  ? ? ?Please make sure to bring any medications you take to your appointments. If you have any questions or concerns please call the office at [phone].  ? ?

## 2022-03-27 NOTE — Assessment & Plan Note (Addendum)
Stable.  BP appropriate in the office. ?- Continue amlodipine-benazepril 10-20 mg ?- BMP today to monitor kidney function ?

## 2022-03-27 NOTE — Progress Notes (Signed)
? ? ?  SUBJECTIVE:  ? ?CHIEF COMPLAINT / HPI:  ? ?HTN ?Patient reports compliance with amlodipine-benazepril 10-20 mg daily. Home BP measurements around 120/70.  ? ?Osteoporosis ?Patient due for her DEXA scan and needs it ordered today. Has been compliant with Bisphosphonate.  ? ?Healthcare Maintenance ?Patient is up-to-date on her cancer screenings.   ? ?PERTINENT  PMH / PSH: Reviewed ? ?OBJECTIVE:  ? ?BP 131/76   Pulse 83   Ht 5' (1.524 m)   Wt 98 lb (44.5 kg)   SpO2 98%   BMI 19.14 kg/m?   ?Gen: well-appearing, NAD ?CV: RRR, no m/r/g appreciated, no peripheral edema ?Pulm: CTAB, no wheezes/crackles ?GI: soft, non-tender, non-distended ? ?ASSESSMENT/PLAN:  ? ?Hypertension ?Stable.  BP appropriate in the office. ?- Continue amlodipine-benazepril 10-20 mg ?- BMP today to monitor kidney function ? ?Osteoporosis ?Stable on bisphosphonate. ?- Continue alendronate 10 mg daily ?- DEXA scan ordered ?  ?Screening for hyperlipidemia ?- Lipid panel ordered ? ?Julie Brandvold, DO ?[facility]  ? ?

## 2022-03-28 LAB — LIPID PANEL
Chol/HDL Ratio: 4.3 ratio (ref 0.0–4.4)
Cholesterol, Total: 251 mg/dL — ABNORMAL HIGH (ref 100–199)
HDL: 58 mg/dL (ref >39)
LDL Chol Calc (NIH): 180 mg/dL — ABNORMAL HIGH (ref 0–99)
Triglycerides: 75 mg/dL (ref 0–149)
VLDL Cholesterol Cal: 13 mg/dL (ref 5–40)

## 2022-03-28 LAB — BASIC METABOLIC PANEL
BUN/Creatinine Ratio: 22 (ref 12–28)
BUN: 17 mg/dL (ref 8–27)
CO2: 23 mmol/L (ref 20–29)
Calcium: 9.7 mg/dL (ref 8.7–10.3)
Chloride: 103 mmol/L (ref 96–106)
Creatinine, Ser: 0.79 mg/dL (ref 0.57–1.00)
Glucose: 68 mg/dL — ABNORMAL LOW (ref 70–99)
Potassium: 3.9 mmol/L (ref 3.5–5.2)
Sodium: 139 mmol/L (ref 134–144)
eGFR: 81 mL/min/{1.73_m2} (ref >59)

## 2022-03-29 ENCOUNTER — Telehealth: Payer: Self-pay | Admitting: Family Medicine

## 2022-03-29 MED ORDER — ROSUVASTATIN CALCIUM 10 MG PO TABS: 10.0000 mg | ORAL_TABLET | Freq: Every day | ORAL | 3 refills | Status: DC

## 2022-03-29 NOTE — Telephone Encounter (Signed)
Called patient with recent lab results. LDL is significantly elevated and ASCVD score is 11.5% (see below). Patient agreeable to starting statin medication at this time. Counseled on possible side effects, patient to follow-up in the next 3 months. ? ?The 10-year ASCVD risk score (Arnett DK, et al., 2019) is: 11.5% ?  Values used to calculate the score: ?    Age: 71 years ?    Sex: Female ?    Is Non-Hispanic African American: No ?    Diabetic: No ?    Tobacco smoker: No ?    Systolic Blood Pressure: 131 mmHg ?    Is BP treated: Yes ?    HDL Cholesterol: 58 mg/dL ?    Total Cholesterol: 251 mg/dL ? ? ?- Rosuvastatin 10mg  daily prescribed ?- Follow-up in the next 2-3 months for direct LDL ? ? ?Kynzley Dowson, DO  ? ?

## 2022-05-06 IMAGING — MG MM DIGITAL SCREENING BILAT W/ TOMO AND CAD
6 of 10 series · 6 of 30 positions shown · non-contrast
Comparison: Previous exam(s).

CLINICAL DATA: Screening.

EXAM:
DIGITAL SCREENING BILATERAL MAMMOGRAM WITH TOMOSYNTHESIS AND CAD
TECHNIQUE: Bilateral screening digital craniocaudal and mediolateral oblique
mammograms were obtained. Bilateral screening digital breast
tomosynthesis was performed. The images were evaluated with
computer-aided detection.

[L CC synth-2D]
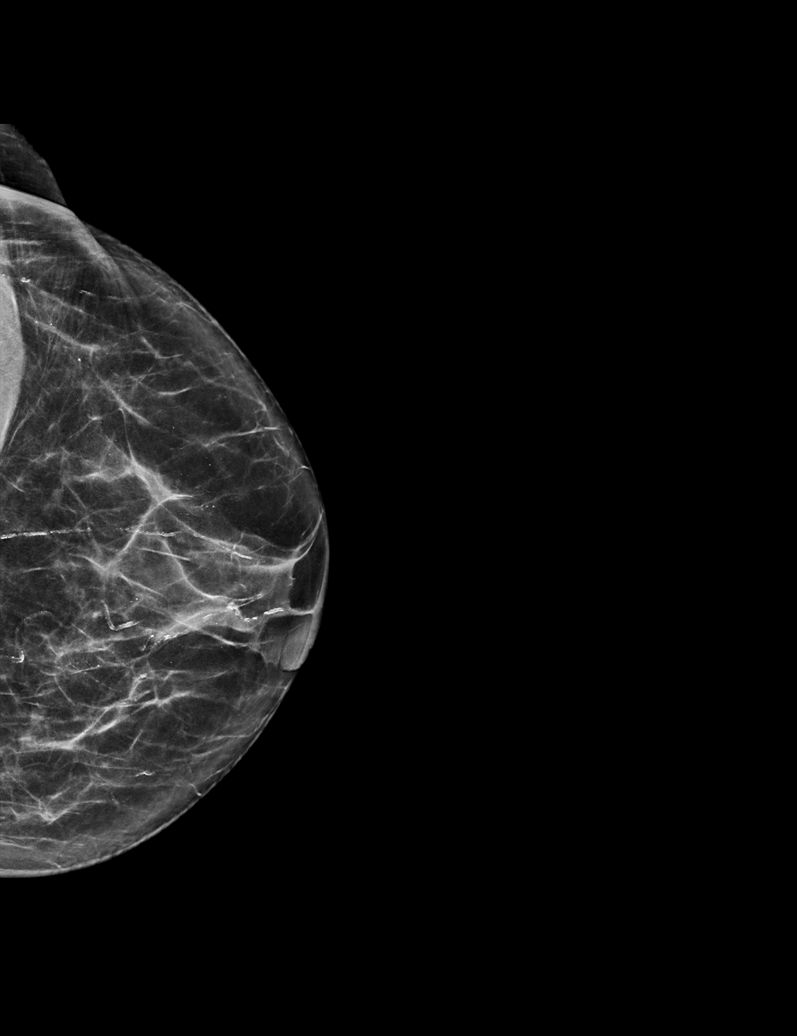

[R MLO synth-2D]
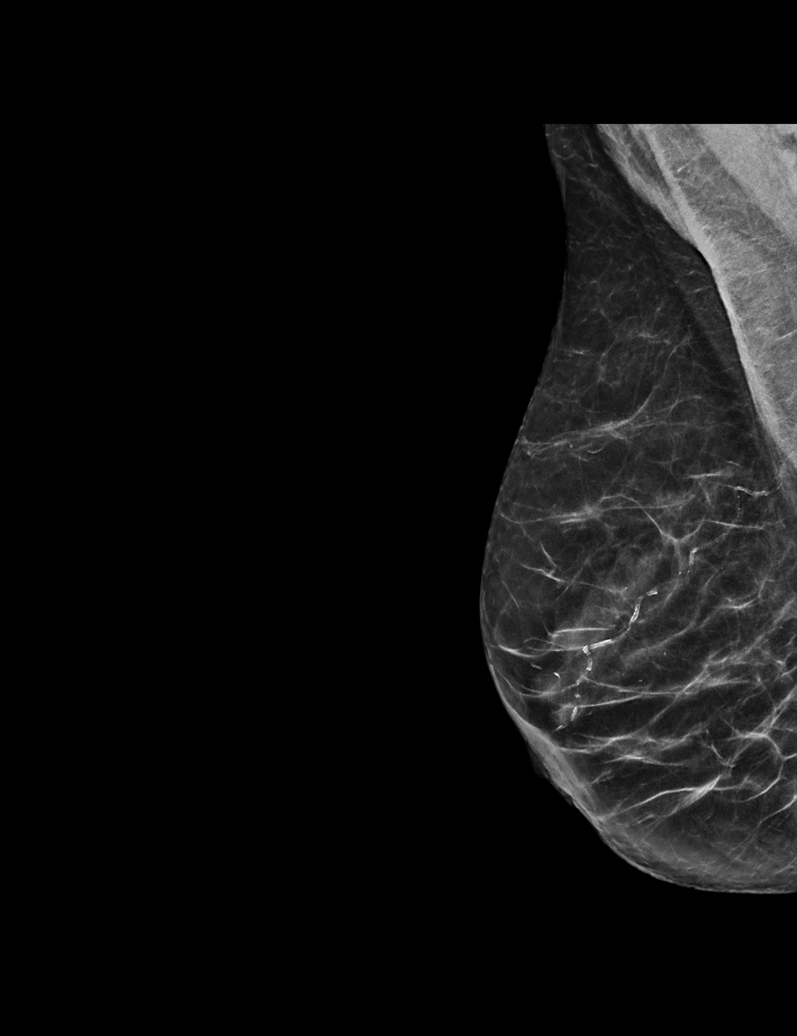

[L MLO synth-2D (1 of 2)]
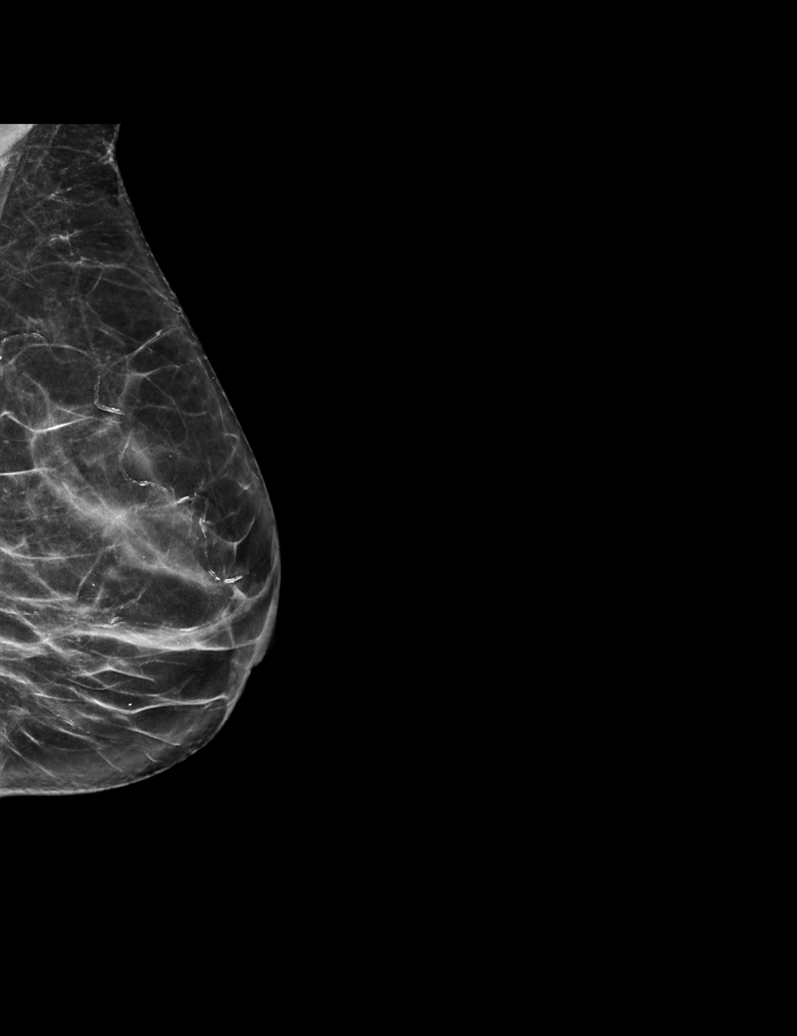

[R CC synth-2D]
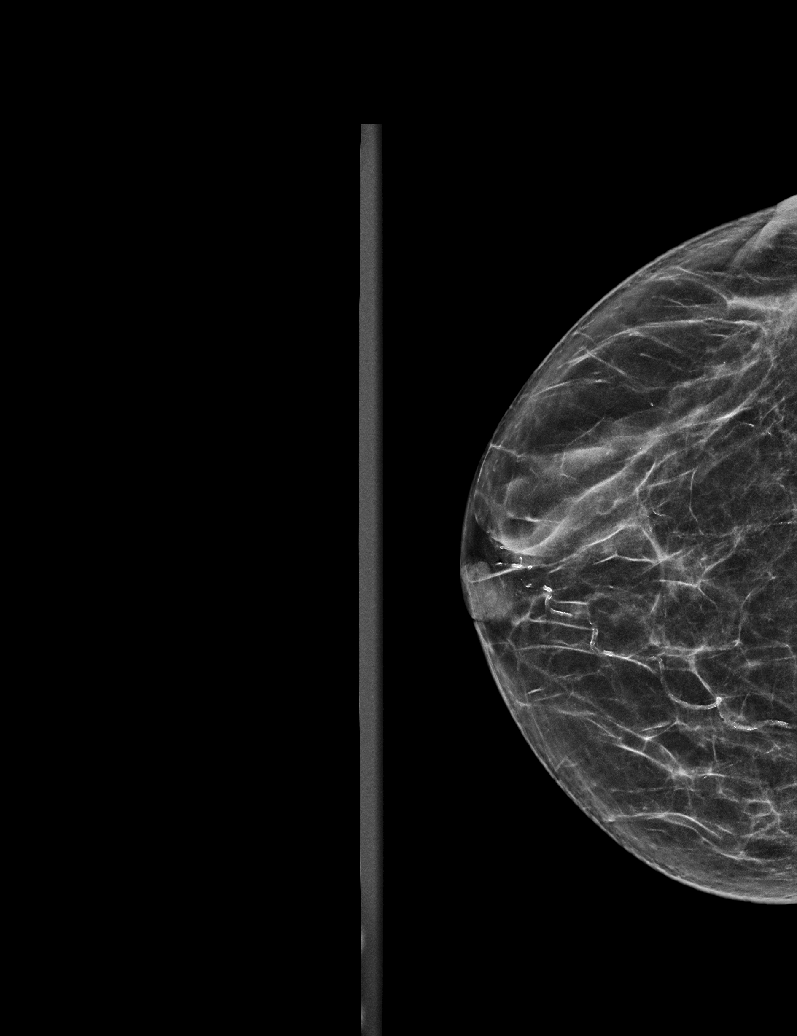

[L MLO synth-2D (2 of 2)]
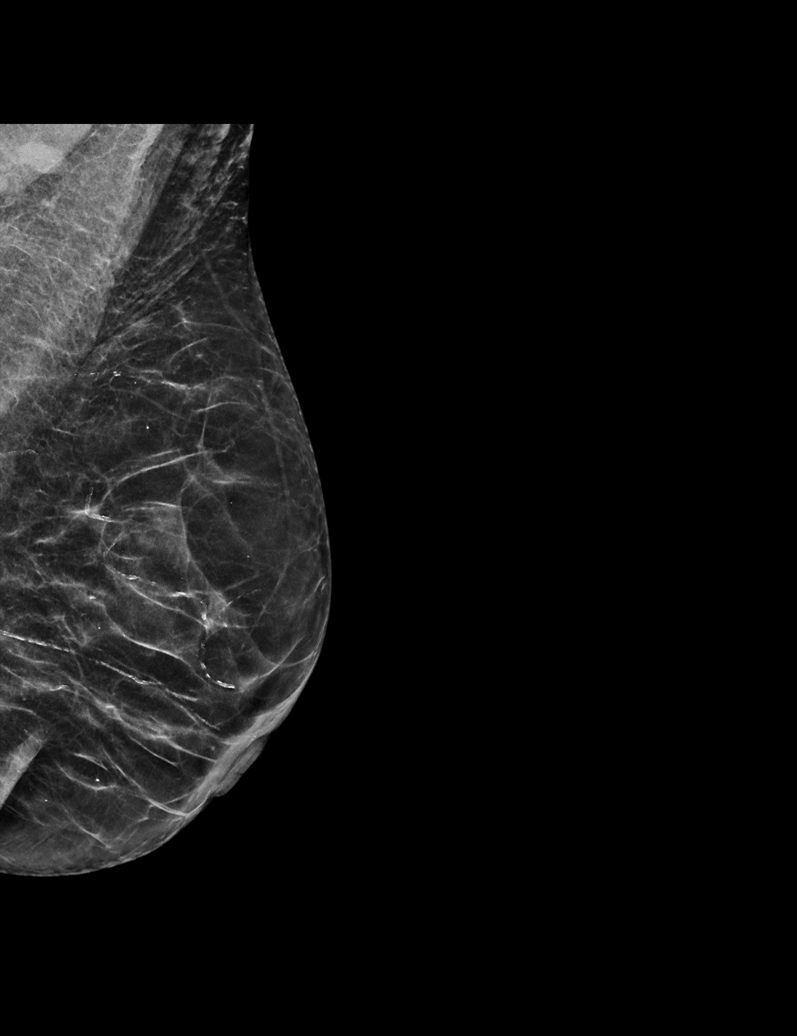

[R MLO tomo · tomo slice 29/56.0]
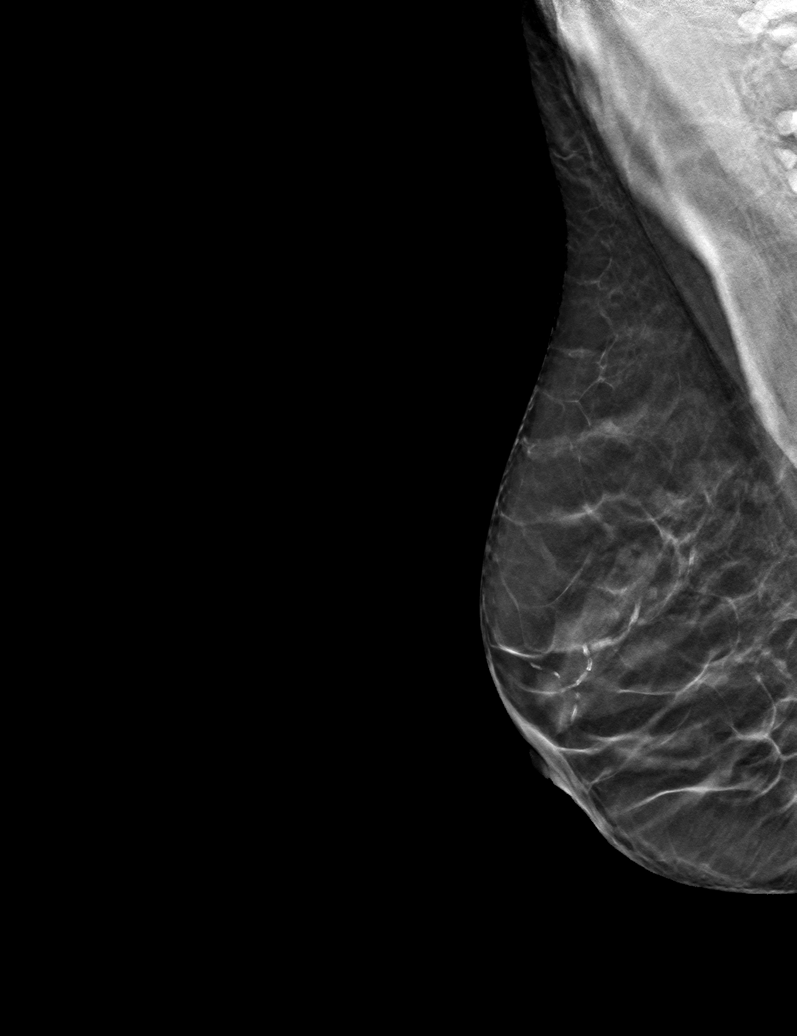

[6 of 30 positions shown; findings below may reference images not displayed]

ACR Breast Density Category c: The breast tissue is heterogeneously
dense, which may obscure small masses.
FINDINGS: There are no findings suspicious for malignancy.
IMPRESSION: No mammographic evidence of malignancy. A result letter of this
screening mammogram will be mailed directly to the patient.

RECOMMENDATION:
Screening mammogram in one year. (Code:[id number])

BI-RADS CATEGORY  1: Negative.

## 2022-05-15 ENCOUNTER — Encounter: Payer: Self-pay | Admitting: *Deleted

## 2022-09-13 ENCOUNTER — Ambulatory Visit
Admission: RE | Admit: 2022-09-13 | Discharge: 2022-09-13 | Disposition: A | Discharge status: Home / Self Care | Payer: Medicare HMO | Source: Ambulatory Visit | Attending: Family Medicine | Admitting: Family Medicine

## 2022-09-13 DIAGNOSIS — M810 Age-related osteoporosis without current pathological fracture: Secondary | ICD-10-CM

## 2022-10-20 ENCOUNTER — Other Ambulatory Visit: Payer: Self-pay | Admitting: Family Medicine

## 2022-12-21 ENCOUNTER — Encounter: Payer: Self-pay | Admitting: Family Medicine

## 2023-01-15 ENCOUNTER — Other Ambulatory Visit: Payer: Self-pay | Admitting: Family Medicine

## 2023-02-14 ENCOUNTER — Other Ambulatory Visit: Payer: Self-pay | Admitting: Family Medicine

## 2023-02-14 DIAGNOSIS — Z1231 Encounter for screening mammogram for malignant neoplasm of breast: Secondary | ICD-10-CM

## 2023-03-28 ENCOUNTER — Ambulatory Visit
Admission: RE | Admit: 2023-03-28 | Discharge: 2023-03-28 | Disposition: A | Discharge status: Home / Self Care | Payer: Medicare HMO | Source: Ambulatory Visit | Attending: Family Medicine | Admitting: Family Medicine

## 2023-03-28 DIAGNOSIS — Z1231 Encounter for screening mammogram for malignant neoplasm of breast: Secondary | ICD-10-CM

## 2023-04-18 ENCOUNTER — Telehealth: Payer: Self-pay | Admitting: Family Medicine

## 2023-04-18 NOTE — Telephone Encounter (Signed)
Contacted Julie Perkins to schedule their annual wellness visit. Appointment made for 04/22/2023.  Thank you,  [facility] Support [facility] Direct dial  [phone]

## 2023-04-22 ENCOUNTER — Ambulatory Visit (INDEPENDENT_AMBULATORY_CARE_PROVIDER_SITE_OTHER): Payer: Medicare HMO

## 2023-04-22 VITALS — Ht 60.0 in | Wt 98.0 lb

## 2023-04-22 DIAGNOSIS — Z0000 Encounter for general adult medical examination without abnormal findings: Secondary | ICD-10-CM

## 2023-04-22 NOTE — Patient Instructions (Addendum)
[name] , Thank you for taking time to come for your Medicare Wellness Visit. I appreciate your ongoing commitment to your health goals. Please review the following plan we discussed and let me know if I can assist you in the future.   These are the goals we discussed:  Goals      DIET - EAT MORE FRUITS AND VEGETABLES     Remain active and independent        This is a list of the screening recommended for you and due dates:  Health Maintenance  Topic Date Due   Flu Shot  07/11/2023   Colon Cancer Screening  08/04/2023   Medicare Annual Wellness Visit  04/21/2024   Mammogram  03/27/2025   DTaP/Tdap/Td vaccine (2 - Td or Tdap) 02/22/2030   Pneumonia Vaccine  Completed   DEXA scan (bone density measurement)  Completed   COVID-19 Vaccine  Completed   Hepatitis C Screening: USPSTF Recommendation to screen - Ages 43-79 yo.  Completed   Zoster (Shingles) Vaccine  Completed   HPV Vaccine  Aged Out    Advanced directives: Information on Advanced Care Planning can be found at [facility] Advance Health Care Directives Advance Health Care Directives ([email or web address]    Conditions/risks identified: Aim for 30 minutes of exercise or brisk walking, 6-8 glasses of water, and 5 servings of fruits and vegetables each day.   Next appointment: Follow up in one year for your annual wellness visit    Preventive Care 65 Years and Older, Female Preventive care refers to lifestyle choices and visits with your health care provider that can promote health and wellness. What does preventive care include? A yearly physical exam. This is also called an annual well check. Dental exams once or twice a year. Routine eye exams. Ask your health care provider how often you should have your eyes checked. Personal lifestyle choices, including: Daily care of your teeth and gums. Regular physical activity. Eating a healthy diet. Avoiding tobacco and drug use. Limiting alcohol  use. Practicing safe sex. Taking low-dose aspirin every day. Taking vitamin and mineral supplements as recommended by your health care provider. What happens during an annual well check? The services and screenings done by your health care provider during your annual well check will depend on your age, overall health, lifestyle risk factors, and family history of disease. Counseling  Your health care provider may ask you questions about your: Alcohol use. Tobacco use. Drug use. Emotional well-being. Home and relationship well-being. Sexual activity. Eating habits. History of falls. Memory and ability to understand (cognition). Work and work Astronomer. Reproductive health. Screening  You may have the following tests or measurements: Height, weight, and BMI. Blood pressure. Lipid and cholesterol levels. These may be checked every 5 years, or more frequently if you are over 60 years old. Skin check. Lung cancer screening. You may have this screening every year starting at age 7 if you have a 30-pack-year history of smoking and currently smoke or have quit within the past 15 years. Fecal occult blood test (FOBT) of the stool. You may have this test every year starting at age 14. Flexible sigmoidoscopy or colonoscopy. You may have a sigmoidoscopy every 5 years or a colonoscopy every 10 years starting at age 71. Hepatitis C blood test. Hepatitis B blood test. Sexually transmitted disease (STD) testing. Diabetes screening. This is done by checking your blood sugar (glucose) after you have not eaten for a while (fasting). You may have  this done every 1-3 years. Bone density scan. This is done to screen for osteoporosis. You may have this done starting at age 86. Mammogram. This may be done every 1-2 years. Talk to your health care provider about how often you should have regular mammograms. Talk with your health care provider about your test results, treatment options, and if necessary,  the need for more tests. Vaccines  Your health care provider may recommend certain vaccines, such as: Influenza vaccine. This is recommended every year. Tetanus, diphtheria, and acellular pertussis (Tdap, Td) vaccine. You may need a Td booster every 10 years. Zoster vaccine. You may need this after age 65. Pneumococcal 13-valent conjugate (PCV13) vaccine. One dose is recommended after age 75. Pneumococcal polysaccharide (PPSV23) vaccine. One dose is recommended after age 82. Talk to your health care provider about which screenings and vaccines you need and how often you need them. This information is not intended to replace advice given to you by your health care provider. Make sure you discuss any questions you have with your health care provider. Document Released: 12/23/2015 Document Revised: 08/15/2016 Document Reviewed: 09/27/2015 Elsevier Interactive Patient Education  2017 [place] Prevention in the Home Falls can cause injuries. They can happen to people of all ages. There are many things you can do to make your home safe and to help prevent falls. What can I do on the outside of my home? Regularly fix the edges of walkways and driveways and fix any cracks. Remove anything that might make you trip as you walk through a door, such as a raised step or threshold. Trim any bushes or trees on the path to your home. Use bright outdoor lighting. Clear any walking paths of anything that might make someone trip, such as rocks or tools. Regularly check to see if handrails are loose or broken. Make sure that both sides of any steps have handrails. Any raised decks and porches should have guardrails on the edges. Have any leaves, snow, or ice cleared regularly. Use sand or salt on walking paths during winter. Clean up any spills in your garage right away. This includes oil or grease spills. What can I do in the bathroom? Use night lights. Install grab bars by the toilet and in the  tub and shower. Do not use towel bars as grab bars. Use non-skid mats or decals in the tub or shower. If you need to sit down in the shower, use a plastic, non-slip stool. Keep the floor dry. Clean up any water that spills on the floor as soon as it happens. Remove soap buildup in the tub or shower regularly. Attach bath mats securely with double-sided non-slip rug tape. Do not have throw rugs and other things on the floor that can make you trip. What can I do in the bedroom? Use night lights. Make sure that you have a light by your bed that is easy to reach. Do not use any sheets or blankets that are too big for your bed. They should not hang down onto the floor. Have a firm chair that has side arms. You can use this for support while you get dressed. Do not have throw rugs and other things on the floor that can make you trip. What can I do in the kitchen? Clean up any spills right away. Avoid walking on wet floors. Keep items that you use a lot in easy-to-reach places. If you need to reach something above you, use a strong step stool  that has a grab bar. Keep electrical cords out of the way. Do not use floor polish or wax that makes floors slippery. If you must use wax, use non-skid floor wax. Do not have throw rugs and other things on the floor that can make you trip. What can I do with my stairs? Do not leave any items on the stairs. Make sure that there are handrails on both sides of the stairs and use them. Fix handrails that are broken or loose. Make sure that handrails are as long as the stairways. Check any carpeting to make sure that it is firmly attached to the stairs. Fix any carpet that is loose or worn. Avoid having throw rugs at the top or bottom of the stairs. If you do have throw rugs, attach them to the floor with carpet tape. Make sure that you have a light switch at the top of the stairs and the bottom of the stairs. If you do not have them, ask someone to add them for  you. What else can I do to help prevent falls? Wear shoes that: Do not have high heels. Have rubber bottoms. Are comfortable and fit you well. Are closed at the toe. Do not wear sandals. If you use a stepladder: Make sure that it is fully opened. Do not climb a closed stepladder. Make sure that both sides of the stepladder are locked into place. Ask someone to hold it for you, if possible. Clearly mark and make sure that you can see: Any grab bars or handrails. First and last steps. Where the edge of each step is. Use tools that help you move around (mobility aids) if they are needed. These include: Canes. Walkers. Scooters. Crutches. Turn on the lights when you go into a dark area. Replace any light bulbs as soon as they burn out. Set up your furniture so you have a clear path. Avoid moving your furniture around. If any of your floors are uneven, fix them. If there are any pets around you, be aware of where they are. Review your medicines with your doctor. Some medicines can make you feel dizzy. This can increase your chance of falling. Ask your doctor what other things that you can do to help prevent falls. This information is not intended to replace advice given to you by your health care provider. Make sure you discuss any questions you have with your health care provider. Document Released: 09/22/2009 Document Revised: 05/03/2016 Document Reviewed: 12/31/2014 Elsevier Interactive Patient Education  2017 [employer].

## 2023-04-22 NOTE — Progress Notes (Signed)
Subjective:   Julie Perkins is a 72 y.o. female who presents for Medicare Annual (Subsequent) preventive examination.  Review of Systems     Cardiac Risk Factors include: advanced age (>19men, >53 women);hypertension     Objective:    Today's Vitals   04/22/23 1347  Weight: 98 lb (44.5 kg)  Height: 5' (1.524 m)   Body mass index is 19.14 kg/m.     04/22/2023    1:50 PM 03/27/2022    2:25 PM 03/29/2021    9:26 AM 03/07/2021   10:14 AM 02/23/2020    8:34 AM 11/10/2019    2:53 PM  Advanced Directives  Does Patient Have a Medical Advance Directive? No No No No No No  Would patient like information on creating a medical advance directive? Yes (MAU/Ambulatory/Procedural Areas - Information given) No - Patient declined No - Patient declined No - Patient declined No - Patient declined No - Patient declined    Current Medications (verified) Outpatient Encounter Medications as of 04/22/2023  Medication Sig   alendronate (FOSAMAX) 10 MG tablet TAKE 1 TABLET EVERY DAY TAKE WITH A FULL GLASS OF WATER BEFORE BREAKFAST ON AN EMPTY STOMACH   amLODipine-benazepril (LOTREL) 10-20 MG capsule TAKE 1 CAPSULE EVERY DAY   Calcium Carb-Cholecalciferol (CALCIUM/VITAMIN D) 600-400 MG-UNIT TABS Take 1,200 mg by mouth daily. The recommendation is to have 1200 mg of calcium daily.  Take 2 tablets daily (do not take with alendronate)   dorzolamide-timolol (COSOPT) 22.3-6.8 MG/ML ophthalmic solution Place 1 drop into both eyes 2 (two) times daily.   loratadine (CLARITIN) 10 MG tablet Take 10 mg by mouth daily as needed for allergies.   [DISCONTINUED] aspirin 81 MG tablet Take 81 mg by mouth daily.   [DISCONTINUED] rosuvastatin (CRESTOR) 10 MG tablet Take 1 tablet (10 mg total) by mouth daily.   No facility-administered encounter medications on file as of 04/22/2023.    Allergies (verified) Patient has no known allergies.   History: Past Medical History:  Diagnosis Date   Allergy    Glaucoma     Hypertension    History reviewed. No pertinent surgical history. Family History  Problem Relation Age of Onset   Hypertension Mother    Hypertension Father    Hypertension Sister    Hypertension Brother    Breast cancer Paternal Aunt    Heart failure Maternal Grandmother    Hypertension Sister    Hypertension Brother    Cancer - Prostate Maternal Grandfather    Social History   Socioeconomic History   Marital status: Divorced    Spouse name: Not on file   Number of children: 2   Years of education: 12   Highest education level: Not on file  Occupational History   Not on file  Tobacco Use   Smoking status: Former    Years: 3    Types: Cigarettes    Quit date: 1985    Years since quitting: 39.3   Smokeless tobacco: Never  Substance and Sexual Activity   Alcohol use: No   Drug use: No   Sexual activity: Yes    Birth control/protection: Post-menopausal  Other Topics Concern   Not on file  Social History Narrative   Patient lives alone in [place].   Patient has 2 sons and 3 grandchildren.   Patient enjoys reading, dancing, walking her 2 dogs, and spending time with family.    Social Determinants of Health   Financial Resource Strain: Low Risk  (04/22/2023)   Overall  Financial Resource Strain (CARDIA)    Difficulty of Paying Living Expenses: Not hard at all  Food Insecurity: No Food Insecurity (04/22/2023)   Hunger Vital Sign    Worried About Running Out of Food in the Last Year: Never true    Ran Out of Food in the Last Year: Never true  Transportation Needs: No Transportation Needs (04/22/2023)   PRAPARE - Administrator, Civil Service (Medical): No    Lack of Transportation (Non-Medical): No  Physical Activity: Sufficiently Active (04/22/2023)   Exercise Vital Sign    Days of Exercise per Week: 5 days    Minutes of Exercise per Session: 30 min  Stress: No Stress Concern Present (04/22/2023)   [employer] of Occupational Health -  Occupational Stress Questionnaire    Feeling of Stress : Not at all  Social Connections: Moderately Integrated (04/22/2023)   Social Connection and Isolation Panel [NHANES]    Frequency of Communication with Friends and Family: More than three times a week    Frequency of Social Gatherings with Friends and Family: Three times a week    Attends Religious Services: More than 4 times per year    Active Member of Clubs or Organizations: Yes    Attends Engineer, structural: More than 4 times per year    Marital Status: Divorced    Tobacco Counseling Counseling given: Not Answered   Clinical Intake:  Pre-visit preparation completed: Yes  Pain : No/denies pain  Diabetes: No  How often do you need to have someone help you when you read instructions, pamphlets, or other written materials from your doctor or pharmacy?: 1 - Never  Diabetic?No   Interpreter Needed?: No  Information entered by :: Kandis Fantasia LPN   Activities of Daily Living    04/22/2023    1:49 PM  In your present state of health, do you have any difficulty performing the following activities:  Hearing? 0  Vision? 0  Difficulty concentrating or making decisions? 0  Walking or climbing stairs? 0  Dressing or bathing? 0  Doing errands, shopping? 0  Preparing Food and eating ? N  Using the Toilet? N  In the past six months, have you accidently leaked urine? N  Do you have problems with loss of bowel control? N  Managing your Medications? N  Managing your Finances? N  Housekeeping or managing your Housekeeping? N    Patient Care Team: Evelena Leyden, DO as PCP - General (Family Medicine) Luane School, OD (Optometry)  Indicate any recent Medical Services you may have received from other than [facility] providers in the past year (date may be approximate).     Assessment:   This is a routine wellness examination for Julie Perkins.  Hearing/Vision screen Hearing Screening - Comments:: Denies hearing  difficulties   Vision Screening - Comments:: up to date with routine eye exams with Dr. Leveda Anna    Dietary issues and exercise activities discussed: Current Exercise Habits: Home exercise routine, Type of exercise: walking;stretching, Time (Minutes): 30, Frequency (Times/Week): 3, Weekly Exercise (Minutes/Week): 90, Intensity: Mild   Goals Addressed             This Visit's Progress    Remain active and independent        Depression Screen    04/22/2023    1:50 PM 03/27/2022    2:26 PM 03/29/2021    9:28 AM 03/07/2021   10:15 AM 03/07/2021   10:11 AM 02/23/2020    8:32 AM 11/10/2019  2:53 PM  PHQ 2/9 Scores  PHQ - 2 Score 0 0 0 0 0 0 0  PHQ- 9 Score  0 0        Fall Risk    04/22/2023    1:49 PM 03/27/2022    2:25 PM 03/29/2021    9:29 AM 03/07/2021   10:15 AM 02/23/2020    8:32 AM  Fall Risk   Falls in the past year? 0 0 0 0 0  Number falls in past yr: 0 0 0    Injury with Fall? 0 0 0    Risk for fall due to : No Fall Risks      Follow up Falls prevention discussed;Education provided;Falls evaluation completed        FALL RISK PREVENTION PERTAINING TO THE HOME:  Any stairs in or around the home? Yes  If so, are there any without handrails? No  Home free of loose throw rugs in walkways, pet beds, electrical cords, etc? Yes  Adequate lighting in your home to reduce risk of falls? Yes   ASSISTIVE DEVICES UTILIZED TO PREVENT FALLS:  Life alert? No  Use of a cane, walker or w/c? No  Grab bars in the bathroom? Yes  Shower chair or bench in shower? No  Elevated toilet seat or a handicapped toilet? Yes   TIMED UP AND GO:  Was the test performed? No . Telephonic visit   Cognitive Function:        04/22/2023    1:50 PM 03/07/2021   10:16 AM  6CIT Screen  What Year? 0 points 0 points  What month? 0 points 0 points  What time? 0 points 0 points  Count back from 20 0 points 0 points  Months in reverse 0 points 0 points  Repeat phrase 0 points 0 points  Total  Score 0 points 0 points    Immunizations Immunization History  Administered Date(s) Administered   COVID-19, mRNA, vaccine(Comirnaty)12 years and older 11/13/2022   Influenza, High Dose Seasonal PF 09/07/2019   Influenza-Unspecified 09/09/2020, 10/11/2020, 10/05/2021, 09/20/2022   PFIZER Comirnaty(Gray Top)Covid-19 Tri-Sucrose Vaccine 09/06/2021   PFIZER(Purple Top)SARS-COV-2 Vaccination 12/31/2019, 01/21/2020, 11/30/2020   PNEUMOCOCCAL CONJUGATE-20 03/29/2021   Pneumococcal Conjugate-13 02/23/2020   Tdap 02/23/2020   Zoster Recombinat (Shingrix) 07/07/2020, 09/08/2020    TDAP status: Up to date  Flu Vaccine status: Up to date  Pneumococcal vaccine status: Up to date  Covid-19 vaccine status: Information provided on how to obtain vaccines.   Qualifies for Shingles Vaccine? Yes   Zostavax completed No   Shingrix Completed?: No.    Education has been provided regarding the importance of this vaccine. Patient has been advised to call insurance company to determine out of pocket expense if they have not yet received this vaccine. Advised may also receive vaccine at local pharmacy or Health Dept. Verbalized acceptance and understanding.  Screening Tests Health Maintenance  Topic Date Due   INFLUENZA VACCINE  07/11/2023   COLONOSCOPY (Pts 45-65yrs Insurance coverage will need to be confirmed)  08/04/2023   Medicare Annual Wellness (AWV)  04/21/2024   MAMMOGRAM  03/27/2025   DTaP/Tdap/Td (2 - Td or Tdap) 02/22/2030   Pneumonia Vaccine 44+ Years old  Completed   DEXA SCAN  Completed   COVID-19 Vaccine  Completed   Hepatitis C Screening  Completed   Zoster Vaccines- Shingrix  Completed   HPV VACCINES  Aged Out    Health Maintenance  There are no preventive care reminders to display for  this patient.   Colorectal cancer screening: Type of screening: Colonoscopy. Completed 08/03/13. Repeat every 10 years  Mammogram status: Completed 03/28/23. Repeat every year  Bone Density  status: Completed 09/13/22. Results reflect: Bone density results: OSTEOPENIA. Repeat every 2 years.  Lung Cancer Screening: (Low Dose CT Chest recommended if Age 84-80 years, 30 pack-year currently smoking OR have quit w/in 15years.) does not qualify.   Lung Cancer Screening Referral: n/a  Additional Screening:  Hepatitis C Screening: does qualify; Completed 11/10/19  Vision Screening: Recommended annual ophthalmology exams for early detection of glaucoma and other disorders of the eye. Is the patient up to date with their annual eye exam?  Yes  Who is the provider or what is the name of the office in which the patient attends annual eye exams? [facility] If pt is not established with a provider, would they like to be referred to a provider to establish care? No .   Dental Screening: Recommended annual dental exams for proper oral hygiene  Community Resource Referral / Chronic Care Management: CRR required this visit?  No   CCM required this visit?  No      Plan:     I have personally reviewed and noted the following in the patient's chart:   Medical and social history Use of alcohol, tobacco or illicit drugs  Current medications and supplements including opioid prescriptions. Patient is not currently taking opioid prescriptions. Functional ability and status Nutritional status Physical activity Advanced directives List of other physicians Hospitalizations, surgeries, and ER visits in previous 12 months Vitals Screenings to include cognitive, depression, and falls Referrals and appointments  In addition, I have reviewed and discussed with patient certain preventive protocols, quality metrics, and best practice recommendations. A written personalized care plan for preventive services as well as general preventive health recommendations were provided to patient.     Durwin Nora, [place]   1/61/0960   Due to this being a virtual visit, the after visit summary with  patients personalized plan was offered to patient via mail or my-chart. Patient would like to access on my-chart  Nurse Notes: No concerns

## 2023-05-08 ENCOUNTER — Encounter: Payer: Self-pay | Admitting: Family Medicine

## 2023-05-08 ENCOUNTER — Ambulatory Visit (INDEPENDENT_AMBULATORY_CARE_PROVIDER_SITE_OTHER): Payer: Medicare HMO | Admitting: Family Medicine

## 2023-05-08 VITALS — BP 140/70 | HR 94 | Ht 60.0 in | Wt 103.5 lb

## 2023-05-08 DIAGNOSIS — I10 Essential (primary) hypertension: Secondary | ICD-10-CM | POA: Diagnosis not present

## 2023-05-08 DIAGNOSIS — Z1211 Encounter for screening for malignant neoplasm of colon: Secondary | ICD-10-CM | POA: Diagnosis not present

## 2023-05-08 DIAGNOSIS — E782 Mixed hyperlipidemia: Secondary | ICD-10-CM

## 2023-05-08 DIAGNOSIS — Z0001 Encounter for general adult medical examination with abnormal findings: Secondary | ICD-10-CM

## 2023-05-08 DIAGNOSIS — M810 Age-related osteoporosis without current pathological fracture: Secondary | ICD-10-CM

## 2023-05-08 HISTORY — DX: Mixed hyperlipidemia: E78.2

## 2023-05-08 NOTE — Patient Instructions (Addendum)
Everything looks good right now. We are going to get a repeat of your cholesterol test and if it is still elevated significantly then we will need to consider starting back a cholesterol medication at a lower dose and increase it as tolerated.   Make sure to check your BP at home and bring a log with you to your next visit.

## 2023-05-08 NOTE — Progress Notes (Signed)
    SUBJECTIVE:   Chief compliant/HPI: annual examination  Julie Perkins is a 72 y.o. who presents today for an annual exam.   Updated history tabs and problem list.   Hyperlipidemia - Patient not taking statin due to side effects - Felt like her thinking was affected and then it was upsetting her stomach  Blood pressure - Checks it at home and systolic is typically around 119 - Has not taken her medication today  OBJECTIVE:   BP (!) 140/70   Pulse 94   Ht 5' (1.524 m)   Wt 103 lb 8 oz (46.9 kg)   SpO2 97%   BMI 20.21 kg/m   Gen: well-appearing, NAD CV: RRR, no m/r/g appreciated, no peripheral edema Pulm: CTAB, no wheezes/crackles GI: soft, non-tender, non-distended HEENT: TM clear bilaterally, normal oropharynx, no thyromegaly or nodules noted, no cervical LAD  ASSESSMENT/PLAN:   Hypertension BP elevated in office, reports normotensive readings at home.  - Continue amlodipine-benazepril 10-20mg  daily - BP log at home and call if persistently >140/90 - BMP today  Osteoporosis On alendronate since 2021, plan for 5 year cycle to stop in 2026 with DEXA at that time.  Mixed hyperlipidemia Did not tolerate Rosuvastatin 10mg  previously. Will get lipid panel today and then calculate ASCVD risk score, if still elevated then we will consider initiation of Atorvastatin and medium dose. If patient doesn't tolerate atorvastatin we can consider pravastatin. - Lipid panel today    Annual Examination  See AVS for age appropriate recommendations.   The 10-year ASCVD risk score (Arnett DK, et al., 2019) is: 14.3%   Values used to calculate the score:     Age: 83 years     Sex: Female     Is Non-Hispanic African American: No     Diabetic: No     Tobacco smoker: No     Systolic Blood Pressure: 140 mmHg     Is BP treated: Yes     HDL Cholesterol: 58 mg/dL     Total Cholesterol: 251 mg/dL  PHQ score 0, reviewed and discussed. Blood pressure reviewed and above goal,  though reports normotensive at home.  Asked about intimate partner violence and patient reports no significant others Advanced directives paperwork given to patient today   Considered the following items based upon USPSTF recommendations: Lipid panel (nonfasting or fasting) discussed based upon AHA recommendations and ordered.  Consider repeat every 4-6 years.  Reviewed risk factors for latent tuberculosis and not indicated  Discussed family history, BRCA testing not indicated. Tool used to risk stratify was Pedigree Assessment tool   Cervical cancer screening:  discussed with patient who states paps before age 65 were all normal with negative HPV Colon cancer screening: due in 2024, last completed in [facility] and referral placed for patient to get next screening in [place]  Follow up in 3 months for BP follow-up.    Julie Leyden, DO  [facility]

## 2023-05-08 NOTE — Assessment & Plan Note (Signed)
Did not tolerate Rosuvastatin 10mg  previously. Will get lipid panel today and then calculate ASCVD risk score, if still elevated then we will consider initiation of Atorvastatin and medium dose. If patient doesn't tolerate atorvastatin we can consider pravastatin. - Lipid panel today

## 2023-05-08 NOTE — Assessment & Plan Note (Signed)
BP elevated in office, reports normotensive readings at home.  - Continue amlodipine-benazepril 10-20mg  daily - BP log at home and call if persistently >140/90 - BMP today

## 2023-05-08 NOTE — Assessment & Plan Note (Signed)
On alendronate since 2021, plan for 5 year cycle to stop in 2026 with DEXA at that time.

## 2023-05-09 ENCOUNTER — Other Ambulatory Visit: Payer: Self-pay | Admitting: Family Medicine

## 2023-05-09 LAB — BASIC METABOLIC PANEL
BUN/Creatinine Ratio: 11 — ABNORMAL LOW (ref 12–28)
BUN: 8 mg/dL (ref 8–27)
CO2: 23 mmol/L (ref 20–29)
Calcium: 9.4 mg/dL (ref 8.7–10.3)
Chloride: 103 mmol/L (ref 96–106)
Creatinine, Ser: 0.72 mg/dL (ref 0.57–1.00)
Glucose: 87 mg/dL (ref 70–99)
Potassium: 3.9 mmol/L (ref 3.5–5.2)
Sodium: 140 mmol/L (ref 134–144)
eGFR: 90 mL/min/{1.73_m2} (ref >59)

## 2023-05-09 LAB — LIPID PANEL
Chol/HDL Ratio: 4.5 ratio — ABNORMAL HIGH (ref 0.0–4.4)
Cholesterol, Total: 277 mg/dL — ABNORMAL HIGH (ref 100–199)
HDL: 61 mg/dL (ref >39)
LDL Chol Calc (NIH): 201 mg/dL — ABNORMAL HIGH (ref 0–99)
Triglycerides: 90 mg/dL (ref 0–149)
VLDL Cholesterol Cal: 15 mg/dL (ref 5–40)

## 2023-05-09 MED ORDER — ATORVASTATIN CALCIUM 10 MG PO TABS: 10.0000 mg | ORAL_TABLET | Freq: Every day | ORAL | 1 refills | Status: DC

## 2023-05-09 NOTE — Progress Notes (Signed)
ASCVD risk score of 14.6%, did not tolerate Rosuvastatin dosing previously. Will trial Atorvastatin a low dose and increase as tolerated. If unable to tolerate this, consider Pravastatin or bempedoic acid.

## 2023-05-28 ENCOUNTER — Encounter: Payer: Self-pay | Admitting: Internal Medicine

## 2023-07-17 ENCOUNTER — Other Ambulatory Visit: Payer: Self-pay

## 2023-07-18 MED ORDER — ATORVASTATIN CALCIUM 10 MG PO TABS: 10.0000 mg | ORAL_TABLET | Freq: Every day | ORAL | 1 refills | Status: DC

## 2023-07-22 ENCOUNTER — Telehealth: Payer: Self-pay | Admitting: *Deleted

## 2023-07-22 NOTE — Telephone Encounter (Signed)
Attempted to call x 2,received voicemail,message left with call back # to return call by end of day or procedure on 08/13/23 will be canceled.

## 2023-08-13 ENCOUNTER — Encounter: Payer: Self-pay | Admitting: Internal Medicine

## 2023-08-13 ENCOUNTER — Encounter: Payer: Medicare HMO | Admitting: Internal Medicine

## 2023-08-20 NOTE — Telephone Encounter (Signed)
Pt rescheduled

## 2023-09-03 ENCOUNTER — Ambulatory Visit (AMBULATORY_SURGERY_CENTER): Payer: Medicare PPO | Admitting: *Deleted

## 2023-09-03 VITALS — Ht 60.0 in | Wt 102.0 lb

## 2023-09-03 DIAGNOSIS — Z1211 Encounter for screening for malignant neoplasm of colon: Secondary | ICD-10-CM

## 2023-09-03 MED ORDER — NA SULFATE-K SULFATE-MG SULF 17.5-3.13-1.6 GM/177ML PO SOLN
1.0000 | Freq: Once | ORAL | 0 refills | Status: AC
Start: 2023-09-03 — End: 2023-09-03

## 2023-09-03 NOTE — Progress Notes (Signed)
Pt's name and DOB verified at the beginning of the pre-visit.  Pt denies any difficulty with ambulating,sitting, laying down or rolling side to side Gave both [facility] main # and MD on call # prior to instructions.  No egg or soy allergy known to patient  No issues known to pt with past sedation with any surgeries or procedures Patient denies ever being intubated Pt has no issues moving head neck or swallowing No FH of Malignant Hyperthermia Pt is not on diet pills Pt is not on home 02  Pt is not on blood thinners  Pt denies issues with constipation  Pt is not on dialysis Pt denise any abnormal heart rhythms  Pt denies any upcoming cardiac testing Pt encouraged to use to use Singlecare or Goodrx to reduce cost  Patient's chart reviewed by Cathlyn Parsons CNRA prior to pre-visit and patient appropriate for the [facility].  Pre-visit completed and red dot placed by patient's name on their procedure day (on provider's schedule).  . Visit by phone Pt states weight is 102 lb Instructed pt why it is important to and  to call if they have any changes in health or new medications. Directed them to the # given and on instructions.   Pt states they will.  Instructions reviewed with pt and pt states understanding. Instructed to review again prior to procedure. Pt states they will.  Instructions sent by mail with coupon and by my chart

## 2023-09-04 ENCOUNTER — Encounter: Payer: Self-pay | Admitting: Internal Medicine

## 2023-09-23 ENCOUNTER — Ambulatory Visit (AMBULATORY_SURGERY_CENTER): Payer: Medicare HMO | Admitting: Internal Medicine

## 2023-09-23 ENCOUNTER — Encounter: Payer: Self-pay | Admitting: Internal Medicine

## 2023-09-23 VITALS — BP 122/62 | HR 57 | Temp 98.0°F | Resp 17 | Ht 60.0 in | Wt 102.0 lb

## 2023-09-23 DIAGNOSIS — Z1211 Encounter for screening for malignant neoplasm of colon: Secondary | ICD-10-CM | POA: Diagnosis not present

## 2023-09-23 DIAGNOSIS — E785 Hyperlipidemia, unspecified: Secondary | ICD-10-CM | POA: Diagnosis not present

## 2023-09-23 DIAGNOSIS — I10 Essential (primary) hypertension: Secondary | ICD-10-CM | POA: Diagnosis not present

## 2023-09-23 MED ORDER — SODIUM CHLORIDE 0.9 % IV SOLN
500.0000 mL | INTRAVENOUS | Status: DC
Start: 2023-09-23 — End: 2023-09-23

## 2023-09-23 NOTE — Progress Notes (Signed)
Vss nad trans to pacu 

## 2023-09-23 NOTE — Progress Notes (Signed)
HISTORY OF PRESENT ILLNESS:  Julie Perkins is a 72 y.o. female is sent today for routine screening colonoscopy.  Had prior colonoscopy in 2014 in [place] which was normal  REVIEW OF SYSTEMS:  All non-GI ROS negative except for  Past Medical History:  Diagnosis Date   Allergy    Cataract    Glaucoma    Hypertension    Mixed hyperlipidemia 05/08/2023    Past Surgical History:  Procedure Laterality Date   COLONOSCOPY      Social History Julie Perkins  reports that she quit smoking about 39 years ago. Her smoking use included cigarettes. She started smoking about 42 years ago. She has never used smokeless tobacco. She reports that she does not drink alcohol and does not use drugs.  family history includes Breast cancer in her paternal aunt; Cancer - Prostate in her maternal grandfather; Heart failure in her maternal grandmother; Hypertension in her brother, brother, father, mother, sister, and sister.  No Known Allergies     PHYSICAL EXAMINATION: Vital signs: BP (!) 127/58   Pulse 67   Temp 98 F (36.7 C)   Ht 5' (1.524 m)   Wt 102 lb (46.3 kg)   SpO2 96%   BMI 19.92 kg/m  General: Well-developed, well-nourished, no acute distress HEENT: Sclerae are anicteric, conjunctiva pink. Oral mucosa intact Lungs: Clear Heart: Regular Abdomen: soft, nontender, nondistended, no obvious ascites, no peritoneal signs, normal bowel sounds. No organomegaly. Extremities: No edema Psychiatric: alert and oriented x3. Cooperative     ASSESSMENT:  Colon cancer screening   PLAN:  Screening colonoscopy

## 2023-09-23 NOTE — Patient Instructions (Signed)
Repeat Colonoscopy is not recommended for surveillance.   YOU HAD AN ENDOSCOPIC PROCEDURE TODAY AT THE [facility]:   Refer to the procedure report that was given to you for any specific questions about what was found during the examination.  If the procedure report does not answer your questions, please call your gastroenterologist to clarify.  If you requested that your care partner not be given the details of your procedure findings, then the procedure report has been included in a sealed envelope for you to review at your convenience later.  YOU SHOULD EXPECT: Some feelings of bloating in the abdomen. Passage of more gas than usual.  Walking can help get rid of the air that was put into your GI tract during the procedure and reduce the bloating. If you had a lower endoscopy (such as a colonoscopy or flexible sigmoidoscopy) you may notice spotting of blood in your stool or on the toilet paper. If you underwent a bowel prep for your procedure, you may not have a normal bowel movement for a few days.  Please Note:  You might notice some irritation and congestion in your nose or some drainage.  This is from the oxygen used during your procedure.  There is no need for concern and it should clear up in a day or so.  SYMPTOMS TO REPORT IMMEDIATELY:  Following lower endoscopy (colonoscopy or flexible sigmoidoscopy):  Excessive amounts of blood in the stool  Significant tenderness or worsening of abdominal pains  Swelling of the abdomen that is new, acute  Fever of 100F or higher  For urgent or emergent issues, a gastroenterologist can be reached at any hour by calling [phone]. Do not use MyChart messaging for urgent concerns.    DIET:  We do recommend a small meal at first, but then you may proceed to your regular diet.  Drink plenty of fluids but you should avoid alcoholic beverages for 24 hours.  ACTIVITY:  You should plan to take it easy for the rest of today and you should  NOT DRIVE or use heavy machinery until tomorrow (because of the sedation medicines used during the test).    FOLLOW UP: Our staff will call the number listed on your records the next business day following your procedure.  We will call around 7:15- 8:00 am to check on you and address any questions or concerns that you may have regarding the information given to you following your procedure. If we do not reach you, we will leave a message.     If any biopsies were taken you will be contacted by phone or by letter within the next 1-3 weeks.  Please call us at [phone] if you have not heard about the biopsies in 3 weeks.    SIGNATURES/CONFIDENTIALITY: You and/or your care partner have signed paperwork which will be entered into your electronic medical record.  These signatures attest to the fact that that the information above on your After Visit Summary has been reviewed and is understood.  Full responsibility of the confidentiality of this discharge information lies with you and/or your care-partner.

## 2023-09-23 NOTE — Op Note (Signed)
[facility] Patient Name: Julie Perkins Procedure Date: 09/23/2023 1:45 PM MRN: [id number] Endoscopist: Wilhemina Bonito. Marina Goodell , MD, [id number] Age: 72 Referring MD:  Date of Birth: [birth year] Gender: Female Account #: [id number] Procedure:                Colonoscopy Indications:              Screening for colorectal malignant neoplasm.                            Previous colonoscopy in [facility] 2014 was                            normal Medicines:                Monitored Anesthesia Care Procedure:                Pre-Anesthesia Assessment:                           - Prior to the procedure, a History and Physical                            was performed, and patient medications and                            allergies were reviewed. The patient's tolerance of                            previous anesthesia was also reviewed. The risks                            and benefits of the procedure and the sedation                            options and risks were discussed with the patient.                            All questions were answered, and informed consent                            was obtained. Prior Anticoagulants: The patient has                            taken no anticoagulant or antiplatelet agents. ASA                            Grade Assessment: II - A patient with mild systemic                            disease. After reviewing the risks and benefits,                            the patient was deemed in satisfactory condition to  undergo the procedure.                           After obtaining informed consent, the colonoscope                            was passed under direct vision. Throughout the                            procedure, the patient's blood pressure, pulse, and                            oxygen saturations were monitored continuously. The                            Olympus Scope SN [id number] was introduced through the                             anus and advanced to the the cecum, identified by                            appendiceal orifice and ileocecal valve. The                            ileocecal valve, appendiceal orifice, and rectum                            were photographed. The quality of the bowel                            preparation was excellent. The colonoscopy was                            performed without difficulty. The patient tolerated                            the procedure well. The bowel preparation used was                            SUPREP via split dose instruction. Scope In: 2:08:38 PM Scope Out: 2:20:50 PM Scope Withdrawal Time: 0 hours 8 minutes 25 seconds  Total Procedure Duration: 0 hours 12 minutes 12 seconds  Findings:                 The entire examined colon appeared normal on direct                            and retroflexion views. Complications:            No immediate complications. Estimated blood loss:                            None. Estimated Blood Loss:     Estimated blood loss: none. Impression:               - The entire examined  colon is normal on direct and                            retroflexion views.                           - No specimens collected. Recommendation:           - Repeat colonoscopy is not recommended for                            screening purposes.                           - Patient has a contact number available for                            emergencies. The signs and symptoms of potential                            delayed complications were discussed with the                            patient. Return to normal activities tomorrow.                            Written discharge instructions were provided to the                            patient.                           - Resume previous diet.                           - Continue present medications.                           - Await pathology results. Wilhemina Bonito. Marina Goodell,  MD 09/23/2023 2:25:28 PM This report has been signed electronically.

## 2023-09-24 ENCOUNTER — Telehealth: Payer: Self-pay | Admitting: *Deleted

## 2023-09-24 NOTE — Telephone Encounter (Signed)
NO answer for post procedure call back. Left VM.

## 2023-10-24 ENCOUNTER — Other Ambulatory Visit: Payer: Self-pay

## 2023-10-24 MED ORDER — ATORVASTATIN CALCIUM 10 MG PO TABS: 10.0000 mg | ORAL_TABLET | Freq: Every day | ORAL | 1 refills | Status: DC

## 2023-10-24 MED ORDER — ALENDRONATE SODIUM 10 MG PO TABS: 10.0000 mg | ORAL_TABLET | Freq: Every day | ORAL | 3 refills | Status: AC

## 2024-01-07 ENCOUNTER — Other Ambulatory Visit: Payer: Self-pay

## 2024-01-07 MED ORDER — AMLODIPINE BESY-BENAZEPRIL HCL 10-20 MG PO CAPS: 1.0000 | ORAL_CAPSULE | Freq: Every day | ORAL | 10 refills | Status: DC

## 2024-02-13 ENCOUNTER — Other Ambulatory Visit: Payer: Self-pay | Admitting: Student

## 2024-02-13 DIAGNOSIS — Z1231 Encounter for screening mammogram for malignant neoplasm of breast: Secondary | ICD-10-CM

## 2024-02-18 ENCOUNTER — Other Ambulatory Visit: Payer: Self-pay

## 2024-02-18 MED ORDER — AMLODIPINE BESY-BENAZEPRIL HCL 10-20 MG PO CAPS: 1.0000 | ORAL_CAPSULE | Freq: Every day | ORAL | 10 refills | Status: AC

## 2024-03-05 DIAGNOSIS — H40053 Ocular hypertension, bilateral: Secondary | ICD-10-CM | POA: Diagnosis not present

## 2024-03-30 ENCOUNTER — Ambulatory Visit
Admission: RE | Admit: 2024-03-30 | Discharge: 2024-03-30 | Disposition: A | Discharge status: Home / Self Care | Source: Ambulatory Visit | Attending: Family Medicine | Admitting: Family Medicine

## 2024-03-30 DIAGNOSIS — Z1231 Encounter for screening mammogram for malignant neoplasm of breast: Secondary | ICD-10-CM

## 2024-05-18 ENCOUNTER — Ambulatory Visit (INDEPENDENT_AMBULATORY_CARE_PROVIDER_SITE_OTHER): Admitting: Student

## 2024-05-18 VITALS — BP 130/70 | HR 74 | Ht 60.0 in | Wt 102.0 lb

## 2024-05-18 DIAGNOSIS — L308 Other specified dermatitis: Secondary | ICD-10-CM | POA: Diagnosis not present

## 2024-05-18 DIAGNOSIS — Z0000 Encounter for general adult medical examination without abnormal findings: Secondary | ICD-10-CM | POA: Diagnosis not present

## 2024-05-18 DIAGNOSIS — I10 Essential (primary) hypertension: Secondary | ICD-10-CM | POA: Diagnosis not present

## 2024-05-18 DIAGNOSIS — E782 Mixed hyperlipidemia: Secondary | ICD-10-CM

## 2024-05-18 MED ORDER — FLUOCINONIDE 0.05 % EX CREA: 1.0000 | TOPICAL_CREAM | Freq: Two times a day (BID) | CUTANEOUS | 0 refills | Status: AC

## 2024-05-18 NOTE — Patient Instructions (Signed)
  Estuvo muy bien verte! Gracias por permitirme participar en su cuidado!   Nuestros planes para hoy: - Utilice crema de permetrina al 5%. - Por favor acuda a su cita con dermatologa.   Tenga cuidado y busque atencin inmediata lo antes posible si tiene alguna inquietud. Recuerde presentarse 15 minutos antes de la hora programada para su cita!  Martin Bronson, DO [facility]  

## 2024-05-18 NOTE — Assessment & Plan Note (Signed)
Well controlled - BMP

## 2024-05-18 NOTE — Progress Notes (Signed)
    SUBJECTIVE:   Chief compliant/HPI: annual examination  Julie Perkins is a 73 y.o. who presents today for an annual exam.  AWV is next month.  History tabs reviewed and updated  Additionally, patient states she was intolerant of 2 cholesterol medications Crestor /Lipitor 2/2 myalgias.  She has been trying diet and exercise.  Reports a history of eczema.  Has had fluocinonide cream prescribed to her in the past, which last several years.  She only uses this during flares.  She is requesting a refill, she brought her previous prescription with her.  OBJECTIVE:   BP 130/70   Pulse 74   Ht 5' (1.524 m)   Wt 102 lb (46.3 kg)   SpO2 95%   BMI 19.92 kg/m    General: NAD, pleasant, Cardio: RRR, no MRG. Cap Refill <2s. Respiratory: CTAB, normal wob on RA Skin: Warm and dry  ASSESSMENT/PLAN:  Assessment & Plan  Assessment & Plan Annual physical exam BP reviewed and at goal.   Considered the following items based upon USPSTF recommendations: Screening for elevated cholesterol: ordered Osteoporosis screening: Patient has osteoporosis, currently on alendronate , repeat DEXA next year  Cervical cancer screening: Not indicated, greater than 65.  Last Pap normal. Breast cancer screening: Already completed.  Repeat in 1 years. Colorectal cancer screening: Up-to-date, repeat screening not recommended per GI report Vaccinations up-to-date.   Follow up in 1 year or sooner if indicated.  MyChart Activation: Already signed up Mixed hyperlipidemia Intolerant of Lipitor/Crestor  2/2 myalgias.  Patient is been trying diet and exercise. - Lipid panel today - Will likely trial Zetia if still elevated Other eczema History of eczema, out of fluocinonide cream. - Refill provided Primary hypertension Well-controlled. - BMP   Lavada Porteous, DO [facility]

## 2024-05-18 NOTE — Assessment & Plan Note (Signed)
 Intolerant of Lipitor/Crestor  2/2 myalgias.  Patient is been trying diet and exercise. - Lipid panel today - Will likely trial Zetia if still elevated

## 2024-05-19 ENCOUNTER — Ambulatory Visit: Payer: Self-pay | Admitting: Student

## 2024-05-19 DIAGNOSIS — E782 Mixed hyperlipidemia: Secondary | ICD-10-CM

## 2024-05-19 LAB — BASIC METABOLIC PANEL WITH GFR
BUN/Creatinine Ratio: 14 (ref 12–28)
BUN: 12 mg/dL (ref 8–27)
CO2: 22 mmol/L (ref 20–29)
Calcium: 9.7 mg/dL (ref 8.7–10.3)
Chloride: 104 mmol/L (ref 96–106)
Creatinine, Ser: 0.86 mg/dL (ref 0.57–1.00)
Glucose: 87 mg/dL (ref 70–99)
Potassium: 4.3 mmol/L (ref 3.5–5.2)
Sodium: 141 mmol/L (ref 134–144)
eGFR: 73 mL/min/{1.73_m2} (ref >59)

## 2024-05-19 LAB — LIPID PANEL
Chol/HDL Ratio: 4 ratio (ref 0.0–4.4)
Cholesterol, Total: 228 mg/dL — ABNORMAL HIGH (ref 100–199)
HDL: 57 mg/dL (ref >39)
LDL Chol Calc (NIH): 158 mg/dL — ABNORMAL HIGH (ref 0–99)
Triglycerides: 75 mg/dL (ref 0–149)
VLDL Cholesterol Cal: 13 mg/dL (ref 5–40)

## 2024-05-19 MED ORDER — EZETIMIBE 10 MG PO TABS: 10.0000 mg | ORAL_TABLET | Freq: Every day | ORAL | 3 refills | Status: AC

## 2024-05-29 DIAGNOSIS — H40053 Ocular hypertension, bilateral: Secondary | ICD-10-CM | POA: Diagnosis not present

## 2024-06-24 ENCOUNTER — Encounter

## 2024-06-29 ENCOUNTER — Encounter

## 2024-11-09 ENCOUNTER — Ambulatory Visit

## 2024-11-09 DIAGNOSIS — Z0000 Encounter for general adult medical examination without abnormal findings: Secondary | ICD-10-CM

## 2024-11-09 NOTE — Progress Notes (Signed)
 No chief complaint on file.    Subjective:   Julie Perkins is a 73 y.o. female who presents for a Medicare Annual Wellness Visit.  Allergies (verified) Patient has no known allergies.   History: Past Medical History:  Diagnosis Date   Allergy    Cataract    Glaucoma    Hypertension    Mixed hyperlipidemia 05/08/2023   Past Surgical History:  Procedure Laterality Date   COLONOSCOPY     Family History  Problem Relation Age of Onset   Hypertension Mother    Hypertension Father    Hypertension Sister    Hypertension Sister    Hypertension Brother    Hypertension Brother    Breast cancer Paternal Aunt    Heart failure Maternal Grandmother    Cancer - Prostate Maternal Grandfather    Colon polyps Neg Hx    Colon cancer Neg Hx    Esophageal cancer Neg Hx    Stomach cancer Neg Hx    Rectal cancer Neg Hx    Social History   Occupational History   Not on file  Tobacco Use   Smoking status: Former    Current packs/day: 0.00    Types: Cigarettes    Start date: 39    Quit date: 1985    Years since quitting: 40.9   Smokeless tobacco: Never  Vaping Use   Vaping status: Never Used  Substance and Sexual Activity   Alcohol use: Not Currently   Drug use: Never   Sexual activity: Not Currently    Birth control/protection: Post-menopausal   Tobacco Counseling Counseling given: Not Answered  SDOH Screenings   Food Insecurity: Patient Declined (11/09/2024)  Housing: Low Risk  (11/09/2024)  Transportation Needs: No Transportation Needs (11/09/2024)  Utilities: Not At Risk (11/09/2024)  Alcohol Screen: Low Risk  (04/22/2023)  Depression (PHQ2-9): Low Risk  (11/09/2024)  Financial Resource Strain: Medium Risk (11/08/2024)  Physical Activity: Insufficiently Active (11/09/2024)  Social Connections: Moderately Integrated (11/09/2024)  Stress: No Stress Concern Present (11/09/2024)  Tobacco Use: Low Risk  (05/29/2024)   Received from [facility]  Recent Concern:  Tobacco Use - Medium Risk (05/18/2024)  Health Literacy: Adequate Health Literacy (11/09/2024)   See flowsheets for full screening details  Depression Screen PHQ 2 & 9 Depression Scale- Over the past 2 weeks, how often have you been bothered by any of the following problems? Little interest or pleasure in doing things: 0 Feeling down, depressed, or hopeless (PHQ Adolescent also includes...irritable): 0 PHQ-2 Total Score: 0 Trouble falling or staying asleep, or sleeping too much: 0 Feeling tired or having little energy: 0 Poor appetite or overeating (PHQ Adolescent also includes...weight loss): 0 Feeling bad about yourself - or that you are a failure or have let yourself or your family down: 0 Trouble concentrating on things, such as reading the newspaper or watching television (PHQ Adolescent also includes...like school work): 0 Moving or speaking so slowly that other people could have noticed. Or the opposite - being so fidgety or restless that you have been moving around a lot more than usual: 0 Thoughts that you would be better off dead, or of hurting yourself in some way: 0 PHQ-9 Total Score: 0     Goals Addressed             This Visit's Progress    Patient states that she would like to stay active.         Visit info / Clinical Intake: Medicare Wellness Visit Type::  Subsequent Annual Wellness Visit Persons participating in visit:: patient Medicare Wellness Visit Mode:: Telephone If telephone:: video declined Because this visit was a virtual/telehealth visit:: unable to obtan vitals due to lack of equipment If Telephone or Video please confirm:: I connected with the patient using audio enabled telemedicine application and verified that I am speaking with the correct person using two identifiers; I discussed the limitations of evaluation and management by telemedicine; The patient expressed understanding and agreed to proceed Patient Location:: Home Provider Location::  home Information given by:: patient Interpreter Needed?: No Pre-visit prep was completed: yes AWV questionnaire completed by patient prior to visit?: no Living arrangements:: (!) lives alone Patient's Overall Health Status Rating: very good Typical amount of pain: none Does pain affect daily life?: no Are you currently prescribed opioids?: no  Dietary Habits and Nutritional Risks How many meals a day?: 3 Eats fruit and vegetables daily?: yes Most meals are obtained by: preparing own meals In the last 2 weeks, have you had any of the following?: none Diabetic:: no  Functional Status Activities of Daily Living (to include ambulation/medication): Independent Ambulation: Independent Medication Administration: Independent Home Management: Independent Manage your own finances?: yes Primary transportation is: driving Concerns about vision?: no *vision screening is required for WTM* Concerns about hearing?: no  Fall Screening Falls in the past year?: 0 Number of falls in past year: 0 Was there an injury with Fall?: 1 Fall Risk Category Calculator: 1 Patient Fall Risk Level: Low Fall Risk  Fall Risk Patient at Risk for Falls Due to: No Fall Risks Fall risk Follow up: Falls evaluation completed; Education provided; Falls prevention discussed  Home and Transportation Safety: All rugs have non-skid backing?: yes All stairs or steps have railings?: (!) no Grab bars in the bathtub or shower?: (!) no Have non-skid surface in bathtub or shower?: yes Good home lighting?: yes Regular seat belt use?: yes Hospital stays in the last year:: no  Cognitive Assessment Difficulty concentrating, remembering, or making decisions? : no Will 6CIT or Mini Cog be Completed: yes What year is it?: 0 points What month is it?: 0 points Give patient an address phrase to remember (5 components): [address] [place] what time is it?: 0 points Count backwards from 20 to 1: 0  points Say the months of the year in reverse: 0 points Repeat the address phrase from earlier: 0 points 6 CIT Score: 0 points  Advance Directives (For Healthcare) Does Patient Have a Medical Advance Directive?: Yes Does patient want to make changes to medical advance directive?: No - Guardian declined Type of Advance Directive: Healthcare Power of [place]; Living will Copy of Healthcare Power of Attorney in Chart?: No - copy requested Copy of Living Will in Chart?: No - copy requested Would patient like information on creating a medical advance directive?: No - Patient declined  Reviewed/Updated  Reviewed/Updated: Reviewed All (Medical, Surgical, Family, Medications, Allergies, Care Teams, Patient Goals)        Objective:    There were no vitals filed for this visit. There is no height or weight on file to calculate BMI.  Current Medications (verified) Outpatient Encounter Medications as of 11/09/2024  Medication Sig   alendronate  (FOSAMAX ) 10 MG tablet Take 1 tablet (10 mg total) by mouth daily before breakfast. Take with a full glass of water on an empty stomach.   amLODipine -benazepril  (LOTREL) 10-20 MG capsule Take 1 capsule by mouth daily.   Calcium  Carb-Cholecalciferol (CALCIUM /VITAMIN D ) 600-400 MG-UNIT TABS Take  1,200 mg by mouth daily. The recommendation is to have 1200 mg of calcium  daily.  Take 2 tablets daily (do not take with alendronate )   dorzolamide-timolol (COSOPT) 22.3-6.8 MG/ML ophthalmic solution Place 1 drop into both eyes 2 (two) times daily.   ezetimibe  (ZETIA ) 10 MG tablet Take 1 tablet (10 mg total) by mouth daily.   fluocinonide  cream (LIDEX ) 0.05 % Apply 1 Application topically 2 (two) times daily. For 5-7 days during flares. Do not use for more than 7 days at a time.   No facility-administered encounter medications on file as of 11/09/2024.   Hearing/Vision screen No results found. Immunizations and Health Maintenance Health Maintenance  Topic Date  Due   Medicare Annual Wellness (AWV)  04/21/2024   COVID-19 Vaccine (7 - 2025-26 season) 04/25/2025   Mammogram  03/30/2026   DTaP/Tdap/Td (2 - Td or Tdap) 02/22/2030   Pneumococcal Vaccine: 50+ Years  Completed   Influenza Vaccine  Completed   Bone Density Scan  Completed   Hepatitis C Screening  Completed   Zoster Vaccines- Shingrix  Completed   Meningococcal B Vaccine  Aged Out   Colonoscopy  Discontinued        Assessment/Plan:  This is a routine wellness examination for Angeliyah.  Patient Care Team: Howell Lunger, DO as PCP - General (Family Medicine) Leila Bound, OD [facility])  I have personally reviewed and noted the following in the patient's chart:   Medical and social history Use of alcohol, tobacco or illicit drugs  Current medications and supplements including opioid prescriptions. Functional ability and status Nutritional status Physical activity Advanced directives List of other physicians Hospitalizations, surgeries, and ER visits in previous 12 months Vitals Screenings to include cognitive, depression, and falls Referrals and appointments  No orders of the defined types were placed in this encounter.  In addition, I have reviewed and discussed with patient certain preventive protocols, quality metrics, and best practice recommendations. A written personalized care plan for preventive services as well as general preventive health recommendations were provided to patient.   Arnette LOISE Hoots, CMA   11/09/2024   No follow-ups on file.  After Visit Summary: (Declined) Due to this being a telephonic visit, with patients personalized plan was offered to patient but patient Declined AVS at this time   Nurse Notes: Patient is doing well. Just got her flu shot and her covid vaccine. She is working on staying active.

## 2024-11-09 NOTE — Patient Instructions (Signed)
 Julie Perkins,  Thank you for taking the time for your Medicare Wellness Visit. I appreciate your continued commitment to your health goals. Please review the care plan we discussed, and feel free to reach out if I can assist you further.  Please note that Annual Wellness Visits do not include a physical exam. Some assessments may be limited, especially if the visit was conducted virtually. If needed, we may recommend an in-person follow-up with your provider.  Ongoing Care Seeing your primary care provider every 3 to 6 months helps us  monitor your health and provide consistent, personalized care.   Referrals If a referral was made during today's visit and you haven't received any updates within two weeks, please contact the referred provider directly to check on the status.  Recommended Screenings:  Health Maintenance  Topic Date Due   Medicare Annual Wellness Visit  04/21/2024   Flu Shot  07/10/2024   COVID-19 Vaccine (6 - 2025-26 season) 08/10/2024   Breast Cancer Screening  03/30/2026   DTaP/Tdap/Td vaccine (2 - Td or Tdap) 02/22/2030   Pneumococcal Vaccine for age over 81  Completed   Osteoporosis screening with Bone Density Scan  Completed   Hepatitis C Screening  Completed   Zoster (Shingles) Vaccine  Completed   Meningitis B Vaccine  Aged Out   Colon Cancer Screening  Discontinued       11/09/2024    8:38 AM  Advanced Directives  Does Patient Have a Medical Advance Directive? Yes  Type of Estate Agent of [place];Living will  Does patient want to make changes to medical advance directive? No - Guardian declined  Copy of Healthcare Power of Attorney in Chart? No - copy requested    Vision: Annual vision screenings are recommended for early detection of glaucoma, cataracts, and diabetic retinopathy. These exams can also reveal signs of chronic conditions such as diabetes and high blood pressure.  Dental: Annual dental screenings help detect early  signs of oral cancer, gum disease, and other conditions linked to overall health, including heart disease and diabetes.  Please see the attached documents for additional preventive care recommendations.
